# Patient Record
Sex: Female | Born: 1965
Health system: Southern US, Community
[De-identification: ages and names within clinical notes are randomized; demographics above are authoritative.]

---

## 1997-06-04 ENCOUNTER — Other Ambulatory Visit: Admission: RE | Admit: 1997-06-04 | Discharge: 1997-06-04 | Payer: Self-pay | Admitting: Obstetrics and Gynecology

## 1998-08-12 ENCOUNTER — Other Ambulatory Visit: Admission: RE | Admit: 1998-08-12 | Discharge: 1998-08-12 | Payer: Self-pay | Admitting: Obstetrics and Gynecology

## 1999-02-26 ENCOUNTER — Inpatient Hospital Stay (HOSPITAL_COMMUNITY): Admission: AD | Admit: 1999-02-26 | Discharge: 1999-03-01 | Payer: Self-pay | Admitting: Obstetrics and Gynecology

## 1999-04-10 ENCOUNTER — Other Ambulatory Visit: Admission: RE | Admit: 1999-04-10 | Discharge: 1999-04-10 | Payer: Self-pay | Admitting: Obstetrics and Gynecology

## 2000-04-22 ENCOUNTER — Other Ambulatory Visit: Admission: RE | Admit: 2000-04-22 | Discharge: 2000-04-22 | Payer: Self-pay | Admitting: Obstetrics and Gynecology

## 2001-06-10 ENCOUNTER — Inpatient Hospital Stay (HOSPITAL_COMMUNITY): Admission: AD | Admit: 2001-06-10 | Discharge: 2001-06-12 | Payer: Self-pay | Admitting: Obstetrics and Gynecology

## 2001-06-10 ENCOUNTER — Encounter (INDEPENDENT_AMBULATORY_CARE_PROVIDER_SITE_OTHER): Payer: Self-pay

## 2001-07-15 ENCOUNTER — Other Ambulatory Visit: Admission: RE | Admit: 2001-07-15 | Discharge: 2001-07-15 | Payer: Self-pay | Admitting: Obstetrics and Gynecology

## 2002-08-19 ENCOUNTER — Other Ambulatory Visit: Admission: RE | Admit: 2002-08-19 | Discharge: 2002-08-19 | Payer: Self-pay | Admitting: Obstetrics and Gynecology

## 2003-08-31 ENCOUNTER — Other Ambulatory Visit: Admission: RE | Admit: 2003-08-31 | Discharge: 2003-08-31 | Payer: Self-pay | Admitting: Obstetrics and Gynecology

## 2003-11-30 ENCOUNTER — Ambulatory Visit: Payer: Self-pay | Admitting: Internal Medicine

## 2003-12-06 ENCOUNTER — Ambulatory Visit: Payer: Self-pay | Admitting: Internal Medicine

## 2004-09-15 ENCOUNTER — Other Ambulatory Visit: Admission: RE | Admit: 2004-09-15 | Discharge: 2004-09-15 | Payer: Self-pay | Admitting: Obstetrics and Gynecology

## 2005-08-27 ENCOUNTER — Ambulatory Visit: Payer: Self-pay | Admitting: Internal Medicine

## 2007-05-12 ENCOUNTER — Encounter: Payer: Self-pay | Admitting: *Deleted

## 2007-05-12 DIAGNOSIS — F411 Generalized anxiety disorder: Secondary | ICD-10-CM | POA: Insufficient documentation

## 2007-05-12 DIAGNOSIS — R0602 Shortness of breath: Secondary | ICD-10-CM

## 2007-05-12 HISTORY — DX: Generalized anxiety disorder: F41.1

## 2007-05-12 HISTORY — DX: Shortness of breath: R06.02

## 2009-06-15 ENCOUNTER — Encounter: Admission: RE | Admit: 2009-06-15 | Discharge: 2009-06-15 | Payer: Self-pay | Admitting: Obstetrics and Gynecology

## 2010-07-23 HISTORY — PX: BREAST ENHANCEMENT SURGERY: SHX7

## 2010-08-07 ENCOUNTER — Other Ambulatory Visit: Payer: Self-pay | Admitting: Plastic Surgery

## 2010-08-07 DIAGNOSIS — Z1231 Encounter for screening mammogram for malignant neoplasm of breast: Secondary | ICD-10-CM

## 2010-08-11 ENCOUNTER — Ambulatory Visit
Admission: RE | Admit: 2010-08-11 | Discharge: 2010-08-11 | Disposition: A | Discharge status: Home / Self Care | Payer: BC Managed Care – PPO | Source: Ambulatory Visit | Attending: Plastic Surgery | Admitting: Plastic Surgery

## 2010-08-11 DIAGNOSIS — Z1231 Encounter for screening mammogram for malignant neoplasm of breast: Secondary | ICD-10-CM

## 2011-10-29 ENCOUNTER — Telehealth: Payer: Self-pay | Admitting: Internal Medicine

## 2011-10-29 NOTE — Telephone Encounter (Signed)
[name] wants to get a flu shot when she brings her dad, [name] next Wed.  Will this be OK? She last saw you in 2007.

## 2011-10-30 NOTE — Telephone Encounter (Signed)
Ok with me 

## 2011-11-02 NOTE — Telephone Encounter (Signed)
Appt scheduled

## 2011-11-08 ENCOUNTER — Ambulatory Visit (INDEPENDENT_AMBULATORY_CARE_PROVIDER_SITE_OTHER): Payer: BC Managed Care – PPO

## 2011-11-08 DIAGNOSIS — Z23 Encounter for immunization: Secondary | ICD-10-CM

## 2013-01-22 HISTORY — PX: VULVAR LESION REMOVAL: SHX5391

## 2013-02-20 ENCOUNTER — Ambulatory Visit (INDEPENDENT_AMBULATORY_CARE_PROVIDER_SITE_OTHER): Payer: PRIVATE HEALTH INSURANCE | Admitting: Surgery

## 2013-02-20 ENCOUNTER — Encounter (INDEPENDENT_AMBULATORY_CARE_PROVIDER_SITE_OTHER): Payer: Self-pay | Admitting: Surgery

## 2013-02-20 ENCOUNTER — Encounter (INDEPENDENT_AMBULATORY_CARE_PROVIDER_SITE_OTHER): Payer: Self-pay

## 2013-02-20 VITALS — BP 118/62 | HR 61 | Temp 97.8°F | Resp 18 | Ht 65.0 in | Wt 130.0 lb

## 2013-02-20 DIAGNOSIS — K648 Other hemorrhoids: Secondary | ICD-10-CM | POA: Insufficient documentation

## 2013-02-20 DIAGNOSIS — Z720 Tobacco use: Secondary | ICD-10-CM | POA: Insufficient documentation

## 2013-02-20 DIAGNOSIS — F17200 Nicotine dependence, unspecified, uncomplicated: Secondary | ICD-10-CM

## 2013-02-20 HISTORY — PX: HEMORRHOID BANDING: SHX5850

## 2013-02-20 HISTORY — DX: Tobacco use: Z72.0

## 2013-02-20 HISTORY — DX: Other hemorrhoids: K64.8

## 2013-02-20 NOTE — Patient Instructions (Signed)
ANORECTAL SURGERY: POST OP INSTRUCTIONS  1. Take your usually prescribed home medications unless otherwise directed. 2. DIET: Follow a light bland diet the first 24 hours after arrival home, such as soup, liquids, crackers, etc.  Be sure to include lots of fluids daily.  Avoid fast food or heavy meals as your are more likely to get nauseated.  Eat a low fat the next few days after surgery.   3. PAIN CONTROL: a. Pain is best controlled by a usual combination of three different methods TOGETHER: i. Ice/Heat ii. Over the counter pain medication iii. Prescription pain medication b. Most patients will experience some swelling and discomfort in the anus/rectal area. and incisions.  Ice packs or heat (30-60 minutes up to 6 times a day) will help. Use ice for the first few days to help decrease swelling and bruising, then switch to heat such as warm towels, sitz baths, warm baths, etc to help relax tight/sore spots and speed recovery.  Some people prefer to use ice alone, heat alone, alternating between ice & heat.  Experiment to what works for you.  Swelling and bruising can take several weeks to resolve.   c. It is helpful to take an over-the-counter pain medication regularly for the first few weeks.  Choose one of the following that works best for you: i. Naproxen (Aleve, etc)  Two 220mg tabs twice a day ii. Ibuprofen (Advil, etc) Three 200mg tabs four times a day (every meal & bedtime) iii. Acetaminophen (Tylenol, etc) 500-650mg four times a day (every meal & bedtime) d. A  prescription for pain medication (such as oxycodone, hydrocodone, etc) should be given to you upon discharge.  Take your pain medication as prescribed.  i. If you are having problems/concerns with the prescription medicine (does not control pain, nausea, vomiting, rash, itching, etc), please call us [phone] to see if we need to switch you to a different pain medicine that will work better for you and/or control your side effect  better. ii. If you need a refill on your pain medication, please contact your pharmacy.  They will contact our office to request authorization. Prescriptions will not be filled after 5 pm or on week-ends.  Use a Sitz Bath 4-8 times a day for relief A sitz bath is a warm water bath taken in the sitting position that covers only the hips and buttocks. It may be used for either healing or hygiene purposes. Sitz baths are also used to relieve pain, itching, or muscle spasms. The water may contain medicine. Moist heat will help you heal and relax.  HOME CARE INSTRUCTIONS  Take 3 to 4 sitz baths a day. 1. Fill the bathtub half full with warm water. 2. Sit in the water and open the drain a little. 3. Turn on the warm water to keep the tub half full. Keep the water running constantly. 4. Soak in the water for 15 to 20 minutes. 5. After the sitz bath, pat the affected area dry first. SEEK MEDICAL CARE IF:  You get worse instead of better. Stop the sitz baths if you get worse.   4. KEEP YOUR BOWELS REGULAR a. The goal is one bowel movement a day b. Avoid getting constipated.  Between the surgery and the pain medications, it is common to experience some constipation.  Increasing fluid intake and taking a fiber supplement (such as Metamucil, Citrucel, FiberCon, MiraLax, etc) 1-2 times a day regularly will usually help prevent this problem from occurring.  A mild   laxative (prune juice, Milk of Magnesia, MiraLax, etc) should be taken according to package directions if there are no bowel movements after 48 hours. c. Watch out for diarrhea.  If you have many loose bowel movements, simplify your diet to bland foods & liquids for a few days.  Stop any stool softeners and decrease your fiber supplement.  Switching to mild anti-diarrheal medications (Kayopectate, Pepto Bismol) can help.  If this worsens or does not improve, please call us.  5. Wound Care a. Remove your bandages the day after surgery.  Unless  discharge instructions indicate otherwise, leave your bandage dry and in place overnight.  Remove the bandage during your first bowel movement.   b. Allow the wound packing to fall out over the next few days.  You can trim exposed gauze / ribbon as it falls out.  You do not need to repack the wound unless instructed otherwise.  Wear an absorbent pad or soft cotton gauze in your underwear as needed to catch any drainage and help keep the area  c. Keep the area clean and dry.  Bathe / shower every day.  Keep the area clean by showering / bathing over the incision / wound.   It is okay to soak an open wound to help wash it.  Wet wipes or showers / gentle washing after bowel movements is often less traumatic than regular toilet paper. d. You may have some styrofoam-like soft packing in the rectum which will come out with the first bowel movement.  e. You will often notice bleeding with bowel movements.  This should slow down by the end of the first week of surgery f. Expect some drainage.  This should slow down, too, by the end of the first week of surgery.  Wear an absorbent pad or soft cotton gauze in your underwear until the drainage stops. 6. ACTIVITIES as tolerated:   a. You may resume regular (light) daily activities beginning the next day-such as daily self-care, walking, climbing stairs-gradually increasing activities as tolerated.  If you can walk 30 minutes without difficulty, it is safe to try more intense activity such as jogging, treadmill, bicycling, low-impact aerobics, swimming, etc. b. Save the most intensive and strenuous activity for last such as sit-ups, heavy lifting, contact sports, etc  Refrain from any heavy lifting or straining until you are off narcotics for pain control.   c. DO NOT PUSH THROUGH PAIN.  Let pain be your guide: If it hurts to do something, don't do it.  Pain is your body warning you to avoid that activity for another week until the pain goes down. d. You may drive when  you are no longer taking prescription pain medication, you can comfortably sit for long periods of time, and you can safely maneuver your car and apply brakes. e. You may have sexual intercourse when it is comfortable.  7. FOLLOW UP in our office a. Please call [facility] at [phone] to set up an appointment to see your surgeon in the office for a follow-up appointment approximately 2 weeks after your surgery. b. Make sure that you call for this appointment the day you arrive home to insure a convenient appointment time. 10. IF YOU HAVE DISABILITY OR FAMILY LEAVE FORMS, BRING THEM TO THE OFFICE FOR PROCESSING.  DO NOT GIVE THEM TO YOUR DOCTOR.        WHEN TO CALL US [phone]: 1. Poor pain control 2. Reactions / problems with new medications (rash/itching, nausea, etc)    3. Fever over 101.5 F (38.5 C) 4. Inability to urinate 5. Nausea and/or vomiting 6. Worsening swelling or bruising 7. Continued bleeding from incision. 8. Increased pain, redness, or drainage from the incision  The clinic staff is available to answer your questions during regular business hours (8:30am-5pm).  Please don't hesitate to call and ask to speak to one of our nurses for clinical concerns.   A surgeon from [facility] is always on call at the hospitals   If you have a medical emergency, go to the nearest emergency room or call 911.    [facility] [address], [address], [place],   [address] ? MAIN: [phone] ? TOLL FREE: [phone] ? FAX [phone] [email or web address]  HEMORRHOIDS  The rectum is the last foot of your colon, and it naturally stretches to hold stool.  Hemorrhoidal piles are natural clusters of blood vessels that help the rectum and anal canal stretch to hold stool and allow bowel movements to eliminate feces.   Hemorrhoids are abnormally swollen blood vessels in the rectum.  Too much pressure in the rectum causes  hemorrhoids by forcing blood to stretch and bulge the walls of the veins, sometimes even rupturing them.  Hemorrhoids can become like varicose veins you might see on a person's legs.  Most people will develop a flare of hemorrhoids in their lifetime.  When bulging hemorrhoidal veins are irritated, they can swell, burn, itch, cause pain, and bleed.  Most flares will calm down gradually own within a few weeks.  However, once hemorrhoids are created, they are difficult to get rid of completely and tend to flare more easily than the first flare.   Fortunately, good habits and simple medical treatment usually control hemorrhoids well, and surgery is needed only in severe cases. Types of Hemorrhoids:  Internal hemorrhoids usually don't initially hurt or itch; they are deep inside the rectum and usually have no sensation. If they begin to push out (prolapse), pain and burning can occur.  However, internal hemorrhoids can bleed.  Anal bleeding should not be ignored since bleeding could come from a dangerous source like colorectal cancer, so persistent rectal bleeding should be investigated by a doctor, sometimes with a colonoscopy.  External hemorrhoids cause most of the symptoms - pain, burning, and itching. Nonirritated hemorrhoids can look like small skin tags coming out of the anus.   Thrombosed hemorrhoids can form when a hemorrhoid blood vessel bursts and causes the hemorrhoid to suddenly swell.  A purple blood clot can form in it and become an excruciatingly painful lump at the anus. Because of these unpleasant symptoms, immediate incision and drainage by a surgeon at an office visit can provide much relief of the pain.    PREVENTION Avoiding the most frequent causes listed below will prevent most cases of hemorrhoids: Constipation Hard stools Diarrhea  Constant sitting  Straining with bowel movements Sitting on the toilet for a long time  Severe coughing  episodes Pregnancy / Childbirth  Heavy  Lifting  Sometimes avoiding the above triggers is difficult:  How can you avoid sitting all day if you have a seated job? Also, we try to avoid coughing and diarrhea, but sometimes it's beyond your control.  Still, there are some practical hints to help: Keep the anal and genital area clean.  Moistened tissues such as flushable wet wipes are less irritating than toilet paper.  Using irrigating showers or bottle irrigation washing gently cleans this sensitive area.     Avoid dry toilet paper when cleaning after bowel movements.  . Keep the anal and genital area dry.  Lightly pat the rectal area dry.  Avoid rubbing.  Talcum or baby powders can help GET YOUR STOOLS SOFT.   This is the most important way to prevent irritated hemorrhoids.  Hard stools are like sandpaper to the anorectal canal and will cause more problems.  The goal: ONE SOFT BOWEL MOVEMENT A DAY!  BMs from every other day to 3 times a day is a tolerable range Treat coughing, diarrhea and constipation early since irritated hemorrhoids may soon follow.  If your main job activity is seated, always stand or walk during your breaks. Make it a point to stand and walk at least 5 minutes every hour and try to shift frequently in your chair to avoid direct rectal pressure.  Always exhale as you strain or lift. Don't hold your breath.  Do not delay or try to prevent a bowel movement when the urge is present. Exercise regularly (walking or jogging 60 minutes a day) to stimulate the bowels to move. No reading or other activity while on the toilet. If bowel movements take longer than 5 minutes, you are too constipated. AVOID CONSTIPATION Drink plenty of liquids (1 1/2 to 2 quarts of water and other fluids a day unless fluid restricted for another medical condition). Liquids that contain caffeine (coffee a, tea, soft drinks) can be dehydrating and should be avoided until constipation is controlled. Consider minimizing milk, as dairy products may be  constipating. Eat plenty of fiber (30g a day ideal, more if needed).  Fiber is the undigested part of plant food that passes into the colon, acting as "natures broom" to encourage bowel motility and movement.  Fiber can absorb and hold large amounts of water. This results in a larger, bulkier stool, which is soft and easier to pass.  Eating foods high in fiber - 12 servings - such as  Vegetables: Root (potatoes, carrots, turnips), Leafy green (lettuce, salad greens, celery, spinach), High residue (cabbage, broccoli, etc.) Fruit: Fresh, Dried (prunes, apricots, cherries), Stewed (applesauce)  Whole grain breads, pasta, whole wheat Bran cereals, muffins, etc. Consider adding supplemental bulking fiber which retains large volumes of water: Psyllium ground seeds (native plant from [place])--available as Metamucil, Konsyl, Effersyllium, Per Diem Fiber, or the less expensive generic forms.  Citrucel  (methylcellulose wood fiber) . FiberCon (Polycarbophil) Polyethylene Glycol - and "artificial" fiber commonly called Miralax or Glycolax.  It is helpful for people with gassy or bloated feelings with regular fiber Flax Seed - a less gassy natural fiber  Laxatives can be useful for a short period if constipation is severe Osmotics (Milk of Magnesia, Fleets Phospho-Soda, Magnesium Citrate)  Stimulants (Senokot,   Castor Oil,  Dulcolax, Ex-Lax)    Laxatives are not a good long-term solution as it can stress the bowels and cause too much mineral loss and dehydration.   Avoid taking laxatives for more than 7 days in a row.  AVOID DIARRHEA Switch to liquids and simpler foods for a few days to avoid stressing your intestines further. Avoid dairy products (especially milk & ice cream) for a short time.  The intestines often can lose the ability to digest lactose when stressed. Avoid foods that cause gassiness or bloating.  Typical foods include beans and other legumes, cabbage, broccoli, and dairy foods.   Every person has some sensitivity to other foods, so listen to your body and avoid those foods that trigger problems for   you. Adding fiber (Citrucel, Metamucil, FiberCon, Flax seed, Miralax) gradually can help thicken stools by absorbing excess fluid and retrain the intestines to act more normally.  Slowly increase the dose over a few weeks.  Too much fiber too soon can backfire and cause cramping & bloating. Probiotics (such as active yogurt, Align, etc) may help repopulate the intestines and colon with normal bacteria and calm down a sensitive digestive tract.  Most studies show it to be of mild help, though, and such products can be costly. Medicines: Bismuth subsalicylate (ex. Kayopectate, Pepto Bismol) every 30 minutes for up to 6 doses can help control diarrhea.  Avoid if pregnant. Loperamide (Immodium) can slow down diarrhea.  Start with two tablets (4mg total) first and then try one tablet every 6 hours.  Avoid if you are having fevers or severe pain.  If you are not better or start feeling worse, stop all medicines and call your doctor for advice Call your doctor if you are getting worse or not better.  Sometimes further testing (cultures, endoscopy, X-ray studies, bloodwork, etc) may be needed to help diagnose and treat the cause of the diarrhea. TREATMENT OF HEMORRHOID FLARE If these preventive measures fail, you must take action right away! Hemorrhoids are one condition that can be mild in the morning and become intolerable by nightfall. Most hemorrhoidal flares take several weeks to calm down.  These suggestions can help: Warm soaks.  This helps more than any topical medication.  Use up to 8 times a day.  Usually sitz baths or sitting in a warm bathtub helps.  Sitting on moist warm towels are helpful.  Switching to ice packs/cool compresses can be helpful  Use a Sitz Bath 4-8 times a day for relief A sitz bath is a warm water bath taken in the sitting position that covers only the hips and  buttocks. It may be used for either healing or hygiene purposes. Sitz baths are also used to relieve pain, itching, or muscle spasms. The water may contain medicine. Moist heat will help you heal and relax.  HOME CARE INSTRUCTIONS  Take 3 to 4 sitz baths a day. 6. Fill the bathtub half full with warm water. 7. Sit in the water and open the drain a little. 8. Turn on the warm water to keep the tub half full. Keep the water running constantly. 9. Soak in the water for 15 to 20 minutes. 10. After the sitz bath, pat the affected area dry first. SEEK MEDICAL CARE IF:  You get worse instead of better. Stop the sitz baths if you get worse.  Normalize your bowels.  Extremes of diarrhea or constipation will make hemorrhoids worse.  One soft bowel movement a day is the goal.  Fiber can help get your bowels regular Wet wipes instead of toilet paper Pain control with a NSAID such as ibuprofen (Advil) or naproxen (Aleve) or acetaminophen (Tylenol) around the clock.  Narcotics are constipating and should be minimized if possible Topical creams contain steroids (bydrocortisone) or local anesthetic (xylocaine) can help make pain and itching more tolerable.   EVALUATION If hemorrhoids are still causing problems, you could benefit by an evaluation by a surgeon.  The surgeon will obtain a history and examine you.  If hemorrhoids are diagnosed, some therapies can be offered in the office, usually with an anoscope into the less sensitive area of the rectum: -injection of hemorrhoids (sclerotherapy) can scar the blood vessels of the swollen/enlarged hemorrhoids to help shrink them down to   a more normal size -rubber banding of the enlarged hemorrhoids to help shrink them down to a more normal size -drainage of the blood clot causing a thrombosed hemorrhoid,  to relieve the severe pain   While 90% of the time such problems from hemorrhoids can be managed without preceding to surgery, sometimes the hemorrhoids require a  operation to control the problem (uncontrolled bleeding, prolapse, pain, etc.).   This involves being placed under general anesthesia where the surgeon can confirm the diagnosis and remove, suture, or staple the hemorrhoid(s).  Your surgeon can help you treat the problem appropriately.    STOP SMOKING!  We strongly recommend that you stop smoking.  Smoking increases the risk of surgery including infection in the form of an open wound, pus formation, abscess, hernia at an incision on the abdomen, etc.  You have an increased risk of other MAJOR complications such as stroke, heart attack, forming clots in the leg and/or lungs, and death.    Smoking Cessation Quitting smoking is important to your health and has many advantages. However, it is not always easy to quit since nicotine is a very addictive drug. Often times, people try 3 times or more before being able to quit. This document explains the best ways for you to prepare to quit smoking. Quitting takes hard work and a lot of effort, but you can do it. ADVANTAGES OF QUITTING SMOKING  You will live longer, feel better, and live better.  Your body will feel the impact of quitting smoking almost immediately.  Within 20 minutes, blood pressure decreases. Your pulse returns to its normal level.  After 8 hours, carbon monoxide levels in the blood return to normal. Your oxygen level increases.  After 24 hours, the chance of having a heart attack starts to decrease. Your breath, hair, and body stop smelling like smoke.  After 48 hours, damaged nerve endings begin to recover. Your sense of taste and smell improve.  After 72 hours, the body is virtually free of nicotine. Your bronchial tubes relax and breathing becomes easier.  After 2 to 12 weeks, lungs can hold more air. Exercise becomes easier and circulation improves.  The risk of having a heart attack, stroke, cancer, or lung disease is greatly reduced.  After 1 year, the risk of coronary  heart disease is cut in half.  After 5 years, the risk of stroke falls to the same as a nonsmoker.  After 10 years, the risk of lung cancer is cut in half and the risk of other cancers decreases significantly.  After 15 years, the risk of coronary heart disease drops, usually to the level of a nonsmoker.  If you are pregnant, quitting smoking will improve your chances of having a healthy baby.  The people you live with, especially any children, will be healthier.  You will have extra money to spend on things other than cigarettes. QUESTIONS TO THINK ABOUT BEFORE ATTEMPTING TO QUIT You may want to talk about your answers with your caregiver.  Why do you want to quit?  If you tried to quit in the past, what helped and what did not?  What will be the most difficult situations for you after you quit? How will you plan to handle them?  Who can help you through the tough times? Your family? Friends? A caregiver?  What pleasures do you get from smoking? What ways can you still get pleasure if you quit? Here are some questions to ask your caregiver:  How can  you help me to be successful at quitting?  What medicine do you think would be best for me and how should I take it?  What should I do if I need more help?  What is smoking withdrawal like? How can I get information on withdrawal? GET READY  Set a quit date.  Change your environment by getting rid of all cigarettes, ashtrays, matches, and lighters in your home, car, or work. Do not let people smoke in your home.  Review your past attempts to quit. Think about what worked and what did not. GET SUPPORT AND ENCOURAGEMENT You have a better chance of being successful if you have help. You can get support in many ways.  Tell your family, friends, and co-workers that you are going to quit and need their support. Ask them not to smoke around you.  Get individual, group, or telephone counseling and support. Programs are available at  [employer] and health centers. Call your local health department for information about programs in your area.  Spiritual beliefs and practices may help some smokers quit.  Download a "quit meter" on your computer to keep track of quit statistics, such as how long you have gone without smoking, cigarettes not smoked, and money saved.  Get a self-help book about quitting smoking and staying off of tobacco. LEARN NEW SKILLS AND BEHAVIORS  Distract yourself from urges to smoke. Talk to someone, go for a walk, or occupy your time with a task.  Change your normal routine. Take a different route to work. Drink tea instead of coffee. Eat breakfast in a different place.  Reduce your stress. Take a hot bath, exercise, or read a book.  Plan something enjoyable to do every day. Reward yourself for not smoking.  Explore interactive web-based programs that specialize in helping you quit. GET MEDICINE AND USE IT CORRECTLY Medicines can help you stop smoking and decrease the urge to smoke. Combining medicine with the above behavioral methods and support can greatly increase your chances of successfully quitting smoking.  Nicotine replacement therapy helps deliver nicotine to your body without the negative effects and risks of smoking. Nicotine replacement therapy includes nicotine gum, lozenges, inhalers, nasal sprays, and skin patches. Some may be available over-the-counter and others require a prescription.  Antidepressant medicine helps people abstain from smoking, but how this works is unknown. This medicine is available by prescription.  Nicotinic receptor partial agonist medicine simulates the effect of nicotine in your brain. This medicine is available by prescription. Ask your caregiver for advice about which medicines to use and how to use them based on your health history. Your caregiver will tell you what side effects to look out for if you choose to be on a medicine or therapy. Carefully  read the information on the package. Do not use any other product containing nicotine while using a nicotine replacement product.  RELAPSE OR DIFFICULT SITUATIONS Most relapses occur within the first 3 months after quitting. Do not be discouraged if you start smoking again. Remember, most people try several times before finally quitting. You may have symptoms of withdrawal because your body is used to nicotine. You may crave cigarettes, be irritable, feel very hungry, cough often, get headaches, or have difficulty concentrating. The withdrawal symptoms are only temporary. They are strongest when you first quit, but they will go away within 10 14 days. To reduce the chances of relapse, try to:  Avoid drinking alcohol. Drinking lowers your chances of successfully quitting.  Reduce  the amount of caffeine you consume. Once you quit smoking, the amount of caffeine in your body increases and can give you symptoms, such as a rapid heartbeat, sweating, and anxiety.  Avoid smokers because they can make you want to smoke.  Do not let weight gain distract you. Many smokers will gain weight when they quit, usually less than 10 pounds. Eat a healthy diet and stay active. You can always lose the weight gained after you quit.  Find ways to improve your mood other than smoking. FOR MORE INFORMATION  www.smokefree.gov    While it can be one of the most difficult things to do, the [place] has programs to help you stop.  Consider talking with your primary care physician about options.  Also, Smoking Cessation classes are available through the [facility]:  The smoking cessation program is a proven-effective program from the American Lung Association. The program is available for anyone 60 and older who currently smokes. The program lasts for 7 weeks and is 8 sessions. Each class will be approximately 1 1/2 hours. The program is every Tuesday.  All classes are 12-1:30pm and same location.  Event Location  Information:  Location: [facility] 2nd Floor Conference Room 2-037; located next to [facility] cross streets: [facility] Entrance into the [facility] is adjacent to the [facility] main entrance. The conference room is located on the 2nd floor.  Parking Instructions: Visitor parking is adjacent to [employer] main entrance and the [employer] [phone] or check the Classes and Support Groups   [email or web address] the event of inclemet weather please call [phone] or view online at [email or web address]

## 2013-02-20 NOTE — Progress Notes (Signed)
Subjective:     Patient ID: [name], female   DOB: 11/30/1965, 48 y.o.   MRN: [id number]  HPI  Note: This dictation was prepared with Dragon/digital dictation along with [employer]. Any transcriptional errors that result from this process are unintentional.       DIEM DICOCCO  1965-12-05 [id number]  Patient Care Team: Juluis Mire, MD as PCP - General (Obstetrics and Gynecology)  This patient is a 48 y.o.female who presents today for surgical evaluation at the request of Dr. Arelia Sneddon.   Reason for visit: Hemorrhoids  Was not smoking female that has a struggle with hemorrhoids for 15 years.  Mainly since having her children.  Intermittent flares.  Has worsened in the past 3 months.  Irritating.  Usually flared with an episode of constipation.  She has tried Preparation H, prescription suppositories, hot baths, cold packs.  Mild relief.  Frustrated.  Mentioned to her gynecologist.  Hemorrhoids confirmed.  Surgical consultation recommended.  She usually has one bowel movement a day.  Occasionally hard.  Never had a colonoscopy.  No prior anorectal intervention such as injection/banding/surgery.  She does smoke a pack a day of cigarettes.  Her father struggled with some hemorrhoid issues.  No personal nor family history of GI/colon cancer, inflammatory bowel disease, irritable bowel syndrome, allergy such as Celiac Sprue, dietary/dairy problems, colitis, ulcers nor gastritis.  No recent sick contacts/gastroenteritis.  No travel outside the country.  No changes in diet.  No dysphagia to solids or liquids.  No significant heartburn or reflux.  No hematochezia, hematemesis, coffee ground emesis.  No evidence of prior gastric/peptic ulceration.    Patient Active Problem List   Diagnosis Date Noted  . Internal hemorrhoids with complication 02/20/2013  . ANXIETY 05/12/2007  . DYSPNEA 05/12/2007    No past medical history on file.  Past Surgical History    Procedure Laterality Date  . Breast enhancement surgery  07/2010    History   Social History  . Marital Status: Single    Spouse Name: N/A    Number of Children: N/A  . Years of Education: N/A   Occupational History  . Not on file.   Social History Main Topics  . Smoking status: Former Games developer  . Smokeless tobacco: Not on file     Comment: 1 pk QD X 30 YRS  . Alcohol Use: Not on file  . Drug Use: Not on file  . Sexual Activity: Not on file   Other Topics Concern  . Not on file   Social History Narrative  . No narrative on file    Family History  Problem Relation Age of Onset  . Cancer Mother     Lung ca    Current Outpatient Prescriptions  Medication Sig Dispense Refill  . Cholecalciferol (VITAMIN D-3 PO) Take by mouth.      . triamcinolone (KENALOG) 0.1 % paste        No current facility-administered medications for this visit.     No Known Allergies  BP 118/62  Pulse 61  Temp(Src) 97.8 F (36.6 C)  Resp 18  Ht 5\' 5"  (1.651 m)  Wt 130 lb (58.968 kg)  BMI 21.63 kg/m2  No results found.   Review of Systems  Constitutional: Negative for fever, chills, diaphoresis, appetite change and fatigue.  HENT: Negative for ear discharge, ear pain, sore throat and trouble swallowing.   Eyes: Negative for photophobia, discharge and visual disturbance.  Respiratory: Negative for cough, choking, chest  tightness and shortness of breath.   Cardiovascular: Negative for chest pain and palpitations.  Gastrointestinal: Negative for nausea, vomiting, abdominal pain, diarrhea, constipation, anal bleeding and rectal pain.  Endocrine: Negative for cold intolerance and heat intolerance.  Genitourinary: Negative for dysuria, frequency and difficulty urinating.  Musculoskeletal: Negative for gait problem, myalgias and neck pain.  Skin: Negative for color change, pallor and rash.  Allergic/Immunologic: Negative for environmental allergies, food allergies and immunocompromised  state.  Neurological: Negative for dizziness, speech difficulty, weakness and numbness.  Hematological: Negative for adenopathy.  Psychiatric/Behavioral: Negative for confusion and agitation. The patient is not nervous/anxious.        Objective:   Physical Exam  Constitutional: She is oriented to person, place, and time. She appears well-developed and well-nourished. No distress.  HENT:  Head: Normocephalic.  Mouth/Throat: Oropharynx is clear and moist. No oropharyngeal exudate.  Eyes: Conjunctivae and EOM are normal. Pupils are equal, round, and reactive to light. No scleral icterus.  Neck: Normal range of motion. Neck supple. No tracheal deviation present.  Cardiovascular: Normal rate, regular rhythm and intact distal pulses.   Pulmonary/Chest: Effort normal and breath sounds normal. No stridor. No respiratory distress. She exhibits no tenderness.  Abdominal: Soft. She exhibits no distension and no mass. There is no tenderness. Hernia confirmed negative in the right inguinal area and confirmed negative in the left inguinal area.  Genitourinary:    No vaginal discharge found.  Exam done with assistance of female Medical Assistant in the room.  Perianal skin clean with good hygiene.  No pruritis.  No pilonidal disease.  No fissure.  No abscess/fistula.  Normal sphincter tone.  Tolerates digital and anoscopic rectal exam.  No rectal masses.  Mildly enlarged right anterior and posterior hemorrhoids with mild prolapsing.  Sensitive.  Left lateral pile more WNL   Musculoskeletal: Normal range of motion. She exhibits no tenderness.       Right elbow: She exhibits normal range of motion.       Left elbow: She exhibits normal range of motion.       Right wrist: She exhibits normal range of motion.       Left wrist: She exhibits normal range of motion.       Right hand: Normal strength noted.       Left hand: Normal strength noted.  Lymphadenopathy:       Head (right side): No posterior  auricular adenopathy present.       Head (left side): No posterior auricular adenopathy present.    She has no cervical adenopathy.    She has no axillary adenopathy.       Right: No inguinal adenopathy present.       Left: No inguinal adenopathy present.  Neurological: She is alert and oriented to person, place, and time. No cranial nerve deficit. She exhibits normal muscle tone. Coordination normal.  Skin: Skin is warm and dry. No rash noted. She is not diaphoretic. No erythema.  Psychiatric: She has a normal mood and affect. Her behavior is normal. Judgment and thought content normal.       Assessment:     Internal hemorrhoids with symptomatic prolapse despite good bowel regimen.     Plan:     I discussed with the patient.  I recommended hemorrhoidal banding.  She agreed:  The anatomy & physiology of the anorectal region was discussed.  The pathophysiology of hemorrhoids and differential diagnosis was discussed.  Natural history progression  was discussed.   I  stressed the importance of a bowel regimen to have daily soft bowel movements to minimize progression of disease.     The patient's symptoms are not adequately controlled.  Therefore, I recommended banding to treat the hemorrhoids.  I went over the technique, risks, benefits, and alternatives.   Goals of post-operative recovery were discussed as well.  Questions were answered.  The patient expressed understanding & wished to proceed.  The patient was positioned in the lateral decubitus position.  Perianal & rectal examination was done.  Using anoscopy, I ligated the hemorrhoids (R anterior & R posterior piles) above the dentate line with banding.  The patient tolerated the procedure well.  Educational handouts further explaining the pathology, treatment options, and bowel regimen were given as well.    STOP SMOKING! We talked to the patient about the dangers of smoking.  We stressed that tobacco use dramatically increases the risk  of peri-operative complications such as infection, tissue necrosis leaving to problems with incision/wound and organ healing, hernia, chronic pain, heart attack, stroke, DVT, pulmonary embolism, and death.  We noted there are programs in our community to help stop smoking.  Information was available.

## 2013-02-24 ENCOUNTER — Encounter (INDEPENDENT_AMBULATORY_CARE_PROVIDER_SITE_OTHER): Payer: Self-pay

## 2013-06-17 ENCOUNTER — Ambulatory Visit (INDEPENDENT_AMBULATORY_CARE_PROVIDER_SITE_OTHER): Payer: 59 | Admitting: Surgery

## 2013-06-17 ENCOUNTER — Encounter (INDEPENDENT_AMBULATORY_CARE_PROVIDER_SITE_OTHER): Payer: Self-pay | Admitting: Surgery

## 2013-06-17 VITALS — BP 116/70 | HR 72 | Resp 18 | Ht 65.0 in | Wt 126.0 lb

## 2013-06-17 DIAGNOSIS — K648 Other hemorrhoids: Secondary | ICD-10-CM

## 2013-06-17 NOTE — Patient Instructions (Signed)
ANORECTAL SURGERY: POST OP INSTRUCTIONS  1. Take your usually prescribed home medications unless otherwise directed. 2. DIET: Follow a light bland diet the first 24 hours after arrival home, such as soup, liquids, crackers, etc.  Be sure to include lots of fluids daily.  Avoid fast food or heavy meals as your are more likely to get nauseated.  Eat a low fat the next few days after surgery.   3. PAIN CONTROL: a. Pain is best controlled by a usual combination of three different methods TOGETHER: i. Ice/Heat ii. Over the counter pain medication iii. Prescription pain medication b. Most patients will experience some swelling and discomfort in the anus/rectal area. and incisions.  Ice packs or heat (30-60 minutes up to 6 times a day) will help. Use ice for the first few days to help decrease swelling and bruising, then switch to heat such as warm towels, sitz baths, warm baths, etc to help relax tight/sore spots and speed recovery.  Some people prefer to use ice alone, heat alone, alternating between ice & heat.  Experiment to what works for you.  Swelling and bruising can take several weeks to resolve.   c. It is helpful to take an over-the-counter pain medication regularly for the first few weeks.  Choose one of the following that works best for you: i. Naproxen (Aleve, etc)  Two 220mg tabs twice a day ii. Ibuprofen (Advil, etc) Three 200mg tabs four times a day (every meal & bedtime) iii. Acetaminophen (Tylenol, etc) 500-650mg four times a day (every meal & bedtime) d. A  prescription for pain medication (such as oxycodone, hydrocodone, etc) should be given to you upon discharge.  Take your pain medication as prescribed.  i. If you are having problems/concerns with the prescription medicine (does not control pain, nausea, vomiting, rash, itching, etc), please call us [phone] to see if we need to switch you to a different pain medicine that will work better for you and/or control your side effect  better. ii. If you need a refill on your pain medication, please contact your pharmacy.  They will contact our office to request authorization. Prescriptions will not be filled after 5 pm or on week-ends.  Use a Sitz Bath 4-8 times a day for relief A sitz bath is a warm water bath taken in the sitting position that covers only the hips and buttocks. It may be used for either healing or hygiene purposes. Sitz baths are also used to relieve pain, itching, or muscle spasms. The water may contain medicine. Moist heat will help you heal and relax.  HOME CARE INSTRUCTIONS  Take 3 to 4 sitz baths a day. 1. Fill the bathtub half full with warm water. 2. Sit in the water and open the drain a little. 3. Turn on the warm water to keep the tub half full. Keep the water running constantly. 4. Soak in the water for 15 to 20 minutes. 5. After the sitz bath, pat the affected area dry first. SEEK MEDICAL CARE IF:  You get worse instead of better. Stop the sitz baths if you get worse.   4. KEEP YOUR BOWELS REGULAR a. The goal is one bowel movement a day b. Avoid getting constipated.  Between the surgery and the pain medications, it is common to experience some constipation.  Increasing fluid intake and taking a fiber supplement (such as Metamucil, Citrucel, FiberCon, MiraLax, etc) 1-2 times a day regularly will usually help prevent this problem from occurring.  A mild   laxative (prune juice, Milk of Magnesia, MiraLax, etc) should be taken according to package directions if there are no bowel movements after 48 hours. c. Watch out for diarrhea.  If you have many loose bowel movements, simplify your diet to bland foods & liquids for a few days.  Stop any stool softeners and decrease your fiber supplement.  Switching to mild anti-diarrheal medications (Kayopectate, Pepto Bismol) can help.  If this worsens or does not improve, please call us.  5. Wound Care a. Remove your bandages the day after surgery.  Unless  discharge instructions indicate otherwise, leave your bandage dry and in place overnight.  Remove the bandage during your first bowel movement.   b. Allow the wound packing to fall out over the next few days.  You can trim exposed gauze / ribbon as it falls out.  You do not need to repack the wound unless instructed otherwise.  Wear an absorbent pad or soft cotton gauze in your underwear as needed to catch any drainage and help keep the area  c. Keep the area clean and dry.  Bathe / shower every day.  Keep the area clean by showering / bathing over the incision / wound.   It is okay to soak an open wound to help wash it.  Wet wipes or showers / gentle washing after bowel movements is often less traumatic than regular toilet paper. d. You may have some styrofoam-like soft packing in the rectum which will come out with the first bowel movement.  e. You will often notice bleeding with bowel movements.  This should slow down by the end of the first week of surgery f. Expect some drainage.  This should slow down, too, by the end of the first week of surgery.  Wear an absorbent pad or soft cotton gauze in your underwear until the drainage stops. 6. ACTIVITIES as tolerated:   a. You may resume regular (light) daily activities beginning the next day-such as daily self-care, walking, climbing stairs-gradually increasing activities as tolerated.  If you can walk 30 minutes without difficulty, it is safe to try more intense activity such as jogging, treadmill, bicycling, low-impact aerobics, swimming, etc. b. Save the most intensive and strenuous activity for last such as sit-ups, heavy lifting, contact sports, etc  Refrain from any heavy lifting or straining until you are off narcotics for pain control.   c. DO NOT PUSH THROUGH PAIN.  Let pain be your guide: If it hurts to do something, don't do it.  Pain is your body warning you to avoid that activity for another week until the pain goes down. d. You may drive when  you are no longer taking prescription pain medication, you can comfortably sit for long periods of time, and you can safely maneuver your car and apply brakes. e. You may have sexual intercourse when it is comfortable.  7. FOLLOW UP in our office a. Please call [facility] at [phone] to set up an appointment to see your surgeon in the office for a follow-up appointment approximately 2 weeks after your surgery. b. Make sure that you call for this appointment the day you arrive home to insure a convenient appointment time. 10. IF YOU HAVE DISABILITY OR FAMILY LEAVE FORMS, BRING THEM TO THE OFFICE FOR PROCESSING.  DO NOT GIVE THEM TO YOUR DOCTOR.        WHEN TO CALL US [phone]: 1. Poor pain control 2. Reactions / problems with new medications (rash/itching, nausea, etc)    3. Fever over 101.5 F (38.5 C) 4. Inability to urinate 5. Nausea and/or vomiting 6. Worsening swelling or bruising 7. Continued bleeding from incision. 8. Increased pain, redness, or drainage from the incision  The clinic staff is available to answer your questions during regular business hours (8:30am-5pm).  Please don't hesitate to call and ask to speak to one of our nurses for clinical concerns.   A surgeon from [facility] is always on call at the hospitals   If you have a medical emergency, go to the nearest emergency room or call 911.    [facility] [address], [address], [place],   [address] ? MAIN: [phone] ? TOLL FREE: [phone] ? FAX [phone] [email or web address]  HEMORRHOIDS  The rectum is the last foot of your colon, and it naturally stretches to hold stool.  Hemorrhoidal piles are natural clusters of blood vessels that help the rectum and anal canal stretch to hold stool and allow bowel movements to eliminate feces.   Hemorrhoids are abnormally swollen blood vessels in the rectum.  Too much pressure in the rectum causes  hemorrhoids by forcing blood to stretch and bulge the walls of the veins, sometimes even rupturing them.  Hemorrhoids can become like varicose veins you might see on a person's legs.  Most people will develop a flare of hemorrhoids in their lifetime.  When bulging hemorrhoidal veins are irritated, they can swell, burn, itch, cause pain, and bleed.  Most flares will calm down gradually own within a few weeks.  However, once hemorrhoids are created, they are difficult to get rid of completely and tend to flare more easily than the first flare.   Fortunately, good habits and simple medical treatment usually control hemorrhoids well, and surgery is needed only in severe cases. Types of Hemorrhoids:  Internal hemorrhoids usually don't initially hurt or itch; they are deep inside the rectum and usually have no sensation. If they begin to push out (prolapse), pain and burning can occur.  However, internal hemorrhoids can bleed.  Anal bleeding should not be ignored since bleeding could come from a dangerous source like colorectal cancer, so persistent rectal bleeding should be investigated by a doctor, sometimes with a colonoscopy.  External hemorrhoids cause most of the symptoms - pain, burning, and itching. Nonirritated hemorrhoids can look like small skin tags coming out of the anus.   Thrombosed hemorrhoids can form when a hemorrhoid blood vessel bursts and causes the hemorrhoid to suddenly swell.  A purple blood clot can form in it and become an excruciatingly painful lump at the anus. Because of these unpleasant symptoms, immediate incision and drainage by a surgeon at an office visit can provide much relief of the pain.    PREVENTION Avoiding the most frequent causes listed below will prevent most cases of hemorrhoids: Constipation Hard stools Diarrhea  Constant sitting  Straining with bowel movements Sitting on the toilet for a long time  Severe coughing  episodes Pregnancy / Childbirth  Heavy  Lifting  Sometimes avoiding the above triggers is difficult:  How can you avoid sitting all day if you have a seated job? Also, we try to avoid coughing and diarrhea, but sometimes it's beyond your control.  Still, there are some practical hints to help: Keep the anal and genital area clean.  Moistened tissues such as flushable wet wipes are less irritating than toilet paper.  Using irrigating showers or bottle irrigation washing gently cleans this sensitive area.     Avoid dry toilet paper when cleaning after bowel movements.  . Keep the anal and genital area dry.  Lightly pat the rectal area dry.  Avoid rubbing.  Talcum or baby powders can help GET YOUR STOOLS SOFT.   This is the most important way to prevent irritated hemorrhoids.  Hard stools are like sandpaper to the anorectal canal and will cause more problems.  The goal: ONE SOFT BOWEL MOVEMENT A DAY!  BMs from every other day to 3 times a day is a tolerable range Treat coughing, diarrhea and constipation early since irritated hemorrhoids may soon follow.  If your main job activity is seated, always stand or walk during your breaks. Make it a point to stand and walk at least 5 minutes every hour and try to shift frequently in your chair to avoid direct rectal pressure.  Always exhale as you strain or lift. Don't hold your breath.  Do not delay or try to prevent a bowel movement when the urge is present. Exercise regularly (walking or jogging 60 minutes a day) to stimulate the bowels to move. No reading or other activity while on the toilet. If bowel movements take longer than 5 minutes, you are too constipated. AVOID CONSTIPATION Drink plenty of liquids (1 1/2 to 2 quarts of water and other fluids a day unless fluid restricted for another medical condition). Liquids that contain caffeine (coffee a, tea, soft drinks) can be dehydrating and should be avoided until constipation is controlled. Consider minimizing milk, as dairy products may be  constipating. Eat plenty of fiber (30g a day ideal, more if needed).  Fiber is the undigested part of plant food that passes into the colon, acting as "natures broom" to encourage bowel motility and movement.  Fiber can absorb and hold large amounts of water. This results in a larger, bulkier stool, which is soft and easier to pass.  Eating foods high in fiber - 12 servings - such as  Vegetables: Root (potatoes, carrots, turnips), Leafy green (lettuce, salad greens, celery, spinach), High residue (cabbage, broccoli, etc.) Fruit: Fresh, Dried (prunes, apricots, cherries), Stewed (applesauce)  Whole grain breads, pasta, whole wheat Bran cereals, muffins, etc. Consider adding supplemental bulking fiber which retains large volumes of water: Psyllium ground seeds (native plant from [place])--available as Metamucil, Konsyl, Effersyllium, Per Diem Fiber, or the less expensive generic forms.  Citrucel  (methylcellulose wood fiber) . FiberCon (Polycarbophil) Polyethylene Glycol - and "artificial" fiber commonly called Miralax or Glycolax.  It is helpful for people with gassy or bloated feelings with regular fiber Flax Seed - a less gassy natural fiber  Laxatives can be useful for a short period if constipation is severe Osmotics (Milk of Magnesia, Fleets Phospho-Soda, Magnesium Citrate)  Stimulants (Senokot,   Castor Oil,  Dulcolax, Ex-Lax)    Laxatives are not a good long-term solution as it can stress the bowels and cause too much mineral loss and dehydration.   Avoid taking laxatives for more than 7 days in a row.  AVOID DIARRHEA Switch to liquids and simpler foods for a few days to avoid stressing your intestines further. Avoid dairy products (especially milk & ice cream) for a short time.  The intestines often can lose the ability to digest lactose when stressed. Avoid foods that cause gassiness or bloating.  Typical foods include beans and other legumes, cabbage, broccoli, and dairy foods.   Every person has some sensitivity to other foods, so listen to your body and avoid those foods that trigger problems for   you. Adding fiber (Citrucel, Metamucil, FiberCon, Flax seed, Miralax) gradually can help thicken stools by absorbing excess fluid and retrain the intestines to act more normally.  Slowly increase the dose over a few weeks.  Too much fiber too soon can backfire and cause cramping & bloating. Probiotics (such as active yogurt, Align, etc) may help repopulate the intestines and colon with normal bacteria and calm down a sensitive digestive tract.  Most studies show it to be of mild help, though, and such products can be costly. Medicines: Bismuth subsalicylate (ex. Kayopectate, Pepto Bismol) every 30 minutes for up to 6 doses can help control diarrhea.  Avoid if pregnant. Loperamide (Immodium) can slow down diarrhea.  Start with two tablets (4mg total) first and then try one tablet every 6 hours.  Avoid if you are having fevers or severe pain.  If you are not better or start feeling worse, stop all medicines and call your doctor for advice Call your doctor if you are getting worse or not better.  Sometimes further testing (cultures, endoscopy, X-ray studies, bloodwork, etc) may be needed to help diagnose and treat the cause of the diarrhea. TREATMENT OF HEMORRHOID FLARE If these preventive measures fail, you must take action right away! Hemorrhoids are one condition that can be mild in the morning and become intolerable by nightfall. Most hemorrhoidal flares take several weeks to calm down.  These suggestions can help: Warm soaks.  This helps more than any topical medication.  Use up to 8 times a day.  Usually sitz baths or sitting in a warm bathtub helps.  Sitting on moist warm towels are helpful.  Switching to ice packs/cool compresses can be helpful  Use a Sitz Bath 4-8 times a day for relief A sitz bath is a warm water bath taken in the sitting position that covers only the hips and  buttocks. It may be used for either healing or hygiene purposes. Sitz baths are also used to relieve pain, itching, or muscle spasms. The water may contain medicine. Moist heat will help you heal and relax.  HOME CARE INSTRUCTIONS  Take 3 to 4 sitz baths a day. 6. Fill the bathtub half full with warm water. 7. Sit in the water and open the drain a little. 8. Turn on the warm water to keep the tub half full. Keep the water running constantly. 9. Soak in the water for 15 to 20 minutes. 10. After the sitz bath, pat the affected area dry first. SEEK MEDICAL CARE IF:  You get worse instead of better. Stop the sitz baths if you get worse.  Normalize your bowels.  Extremes of diarrhea or constipation will make hemorrhoids worse.  One soft bowel movement a day is the goal.  Fiber can help get your bowels regular Wet wipes instead of toilet paper Pain control with a NSAID such as ibuprofen (Advil) or naproxen (Aleve) or acetaminophen (Tylenol) around the clock.  Narcotics are constipating and should be minimized if possible Topical creams contain steroids (bydrocortisone) or local anesthetic (xylocaine) can help make pain and itching more tolerable.   EVALUATION If hemorrhoids are still causing problems, you could benefit by an evaluation by a surgeon.  The surgeon will obtain a history and examine you.  If hemorrhoids are diagnosed, some therapies can be offered in the office, usually with an anoscope into the less sensitive area of the rectum: -injection of hemorrhoids (sclerotherapy) can scar the blood vessels of the swollen/enlarged hemorrhoids to help shrink them down to   a more normal size -rubber banding of the enlarged hemorrhoids to help shrink them down to a more normal size -drainage of the blood clot causing a thrombosed hemorrhoid,  to relieve the severe pain   While 90% of the time such problems from hemorrhoids can be managed without preceding to surgery, sometimes the hemorrhoids require a  operation to control the problem (uncontrolled bleeding, prolapse, pain, etc.).   This involves being placed under general anesthesia where the surgeon can confirm the diagnosis and remove, suture, or staple the hemorrhoid(s).  Your surgeon can help you treat the problem appropriately.    GETTING TO GOOD BOWEL HEALTH. Irregular bowel habits such as constipation and diarrhea can lead to many problems over time.  Having one soft bowel movement a day is the most important way to prevent further problems.  The anorectal canal is designed to handle stretching and feces to safely manage our ability to get rid of solid waste (feces, poop, stool) out of our body.  BUT, hard constipated stools can act like ripping concrete bricks and diarrhea can be a burning fire to this very sensitive area of our body, causing inflamed hemorrhoids, anal fissures, increasing risk is perirectal abscesses, abdominal pain/bloating, an making irritable bowel worse.     The goal: ONE SOFT BOWEL MOVEMENT A DAY!  To have soft, regular bowel movements:    Drink at least 8 tall glasses of water a day.     Take plenty of fiber.  Fiber is the undigested part of plant food that passes into the colon, acting s "natures broom" to encourage bowel motility and movement.  Fiber can absorb and hold large amounts of water. This results in a larger, bulkier stool, which is soft and easier to pass. Work gradually over several weeks up to 6 servings a day of fiber (25g a day even more if needed) in the form of: o Vegetables -- Root (potatoes, carrots, turnips), leafy green (lettuce, salad greens, celery, spinach), or cooked high residue (cabbage, broccoli, etc) o Fruit -- Fresh (unpeeled skin & pulp), Dried (prunes, apricots, cherries, etc ),  or stewed ( applesauce)  o Whole grain breads, pasta, etc (whole wheat)  o Bran cereals    Bulking Agents -- This type of water-retaining fiber generally is easily obtained each day by one of the following:   o Psyllium bran -- The psyllium plant is remarkable because its ground seeds can retain so much water. This product is available as Metamucil, Konsyl, Effersyllium, Per Diem Fiber, or the less expensive generic preparation in drug and health food stores. Although labeled a laxative, it really is not a laxative.  o Methylcellulose -- This is another fiber derived from wood which also retains water. It is available as Citrucel. o Polyethylene Glycol - and "artificial" fiber commonly called Miralax or Glycolax.  It is helpful for people with gassy or bloated feelings with regular fiber o Flax Seed - a less gassy fiber than psyllium   No reading or other relaxing activity while on the toilet. If bowel movements take longer than 5 minutes, you are too constipated   AVOID CONSTIPATION.  High fiber and water intake usually takes care of this.  Sometimes a laxative is needed to stimulate more frequent bowel movements, but    Laxatives are not a good long-term solution as it can wear the colon out. o Osmotics (Milk of Magnesia, Fleets phosphosoda, Magnesium citrate, MiraLax, GoLytely) are safer than  o Stimulants (Senokot, Castor Oil, Dulcolax,   Ex Lax)    o Do not take laxatives for more than 7days in a row.    IF SEVERELY CONSTIPATED, try a Bowel Retraining Program: o Do not use laxatives.  o Eat a diet high in roughage, such as bran cereals and leafy vegetables.  o Drink six (6) ounces of prune or apricot juice each morning.  o Eat two (2) large servings of stewed fruit each day.  o Take one (1) heaping tablespoon of a psyllium-based bulking agent twice a day. Use sugar-free sweetener when possible to avoid excessive calories.  o Eat a normal breakfast.  o Set aside 15 minutes after breakfast to sit on the toilet, but do not strain to have a bowel movement.  o If you do not have a bowel movement by the third day, use an enema and repeat the above steps.    Controlling diarrhea o Switch to liquids and  simpler foods for a few days to avoid stressing your intestines further. o Avoid dairy products (especially milk & ice cream) for a short time.  The intestines often can lose the ability to digest lactose when stressed. o Avoid foods that cause gassiness or bloating.  Typical foods include beans and other legumes, cabbage, broccoli, and dairy foods.  Every person has some sensitivity to other foods, so listen to our body and avoid those foods that trigger problems for you. o Adding fiber (Citrucel, Metamucil, psyllium, Miralax) gradually can help thicken stools by absorbing excess fluid and retrain the intestines to act more normally.  Slowly increase the dose over a few weeks.  Too much fiber too soon can backfire and cause cramping & bloating. o Probiotics (such as active yogurt, Align, etc) may help repopulate the intestines and colon with normal bacteria and calm down a sensitive digestive tract.  Most studies show it to be of mild help, though, and such products can be costly. o Medicines:   Bismuth subsalicylate (ex. Kayopectate, Pepto Bismol) every 30 minutes for up to 6 doses can help control diarrhea.  Avoid if pregnant.   Loperamide (Immodium) can slow down diarrhea.  Start with two tablets (4mg total) first and then try one tablet every 6 hours.  Avoid if you are having fevers or severe pain.  If you are not better or start feeling worse, stop all medicines and call your doctor for advice o Call your doctor if you are getting worse or not better.  Sometimes further testing (cultures, endoscopy, X-ray studies, bloodwork, etc) may be needed to help diagnose and treat the cause of the diarrhea. o  

## 2013-06-17 NOTE — Progress Notes (Signed)
Subjective:     Patient ID: [name], female   DOB: Apr 15, 1965, 48 y.o.   MRN: [id number]  HPI  Note: This dictation was prepared with Dragon/digital dictation along with [employer]. Any transcriptional errors that result from this process are unintentional.       [name]  05-18-1965 [id number]  Patient Care Team: Juluis Mire, MD as PCP - General (Obstetrics and Gynecology)  Procedure (Date: 02/20/2013):  Hemorrhoidal banding  This patient returns for surgical re-evaluation.  She notes that the banding of hemorrhoids that I did in January helped for several months.  However she began to notice irritation after bowel movements last month.  It has persisted.  She has remained compliant on the MiraLAX every day.  She now has daily bowel movements.  Denies any prolapsing material.  Not much in the way of bleeding.  Just irritating.  She wondered if repeat banding or other intervention would help.  No fevers or chills.  Patient Active Problem List   Diagnosis Date Noted  . Internal hemorrhoids with prolapse 02/20/2013  . Tobacco abuse 02/20/2013  . ANXIETY 05/12/2007  . DYSPNEA 05/12/2007    History reviewed. No pertinent past medical history.  Past Surgical History  Procedure Laterality Date  . Breast enhancement surgery  07/2010  . Vulvar lesion removal  Jan 2015    Dr Richardean Chimera  . Hemorrhoid banding  02/20/2013    History   Social History  . Marital Status: Married    Spouse Name: N/A    Number of Children: N/A  . Years of Education: N/A   Occupational History  . Not on file.   Social History Main Topics  . Smoking status: Former Games developer  . Smokeless tobacco: Not on file     Comment: 1 pk QD X 30 YRS  . Alcohol Use: Not on file  . Drug Use: Not on file  . Sexual Activity: Not on file   Other Topics Concern  . Not on file   Social History Narrative  . No narrative on file    Family History  Problem Relation Age  of Onset  . Cancer Mother     Lung ca    Current Outpatient Prescriptions  Medication Sig Dispense Refill  . Cholecalciferol (VITAMIN D-3 PO) Take by mouth.      . triamcinolone (KENALOG) 0.1 % paste        No current facility-administered medications for this visit.     No Known Allergies  BP 116/70  Pulse 72  Resp 18  Ht 5\' 5"  (1.651 m)  Wt 126 lb (57.153 kg)  BMI 20.97 kg/m2  No results found.   Review of Systems  Constitutional: Negative for fever, chills and diaphoresis.  HENT: Negative for ear pain, sore throat and trouble swallowing.   Eyes: Negative for photophobia and visual disturbance.  Respiratory: Negative for cough and choking.   Cardiovascular: Negative for chest pain and palpitations.  Gastrointestinal: Positive for rectal pain. Negative for nausea, vomiting, abdominal pain, diarrhea, constipation, blood in stool, abdominal distention and anal bleeding.  Genitourinary: Negative for dysuria, frequency and difficulty urinating.  Musculoskeletal: Negative for gait problem and myalgias.  Skin: Negative for color change, pallor and rash.  Neurological: Negative for dizziness, speech difficulty, weakness and numbness.  Hematological: Negative for adenopathy.  Psychiatric/Behavioral: Negative for confusion and agitation. The patient is not nervous/anxious.        Objective:   Physical Exam  Constitutional: She is  oriented to person, place, and time. She appears well-developed and well-nourished. No distress.  HENT:  Head: Normocephalic.  Mouth/Throat: Oropharynx is clear and moist. No oropharyngeal exudate.  Eyes: Conjunctivae and EOM are normal. Pupils are equal, round, and reactive to light. No scleral icterus.  Neck: Normal range of motion. No tracheal deviation present.  Cardiovascular: Normal rate and intact distal pulses.   Pulmonary/Chest: Effort normal. No respiratory distress. She exhibits no tenderness.  Abdominal: Soft. She exhibits no  distension. There is no tenderness. Hernia confirmed negative in the right inguinal area and confirmed negative in the left inguinal area.  Incisions clean with normal healing ridges.  No hernias  Genitourinary: No vaginal discharge found.  Exam done with assistance of female Medical Assistant in the room.  Perianal skin clean with good hygiene.  No pruritis.  No pilonidal disease.  No fissure.  No abscess/fistula.  Normal sphincter tone.  Sensitive but tolerates digital and anoscopic rectal exam.  No rectal masses.   Right anterior/hemorrhoids with .  Less prolapse.  Some external hemorrhoid component as well.  Left lateral pile not enlarged     Musculoskeletal: Normal range of motion. She exhibits no tenderness.  Lymphadenopathy:       Right: No inguinal adenopathy present.       Left: No inguinal adenopathy present.  Neurological: She is alert and oriented to person, place, and time. No cranial nerve deficit. She exhibits normal muscle tone. Coordination normal.  Skin: Skin is warm and dry. No rash noted. She is not diaphoretic.  Psychiatric: She has a normal mood and affect. Her behavior is normal.       Assessment:     Internal hemorrhoids with mild prolapse.  Improved overall but still symptomatic.     Plan:     After the patient options.  I think that she would benefit from surgery.  She would like to try banding one more time.  Proceeded:   The anatomy & physiology of the anorectal region was discussed.  The pathophysiology of hemorrhoids and differential diagnosis was discussed.  Natural history progression  was discussed.   I stressed the importance of a bowel regimen to have daily soft bowel movements to minimize progression of disease.     The patient's symptoms are not adequately controlled.  Therefore, I recommended banding to treat the hemorrhoids.  I went over the technique, risks, benefits, and alternatives.   Goals of post-operative recovery were discussed as well.   Questions were answered.  The patient expressed understanding & wished to proceed.  The patient was positioned in the lateral decubitus position.  Perianal & rectal examination was done.  Using anoscopy, I ligated above the hemorrhoids (right anterior & posterior) above the dentate line with banding.  The patient tolerated the procedure well.  Educational handouts further explaining the pathology, treatment options, and bowel regimen were given as well.   If this does not succeed, plan THD hemorrhoidal ligation/excision with possible removal of the external hemorrhoid tissue

## 2017-03-28 DIAGNOSIS — Z0000 Encounter for general adult medical examination without abnormal findings: Secondary | ICD-10-CM | POA: Diagnosis not present

## 2017-03-28 DIAGNOSIS — R5383 Other fatigue: Secondary | ICD-10-CM | POA: Diagnosis not present

## 2017-03-28 DIAGNOSIS — R682 Dry mouth, unspecified: Secondary | ICD-10-CM | POA: Diagnosis not present

## 2017-08-28 DIAGNOSIS — H52223 Regular astigmatism, bilateral: Secondary | ICD-10-CM | POA: Diagnosis not present

## 2017-08-28 DIAGNOSIS — H524 Presbyopia: Secondary | ICD-10-CM | POA: Diagnosis not present

## 2017-08-28 DIAGNOSIS — H5203 Hypermetropia, bilateral: Secondary | ICD-10-CM | POA: Diagnosis not present

## 2017-09-12 DIAGNOSIS — H52223 Regular astigmatism, bilateral: Secondary | ICD-10-CM | POA: Diagnosis not present

## 2020-06-16 ENCOUNTER — Other Ambulatory Visit: Payer: Self-pay | Admitting: Obstetrics and Gynecology

## 2020-06-16 DIAGNOSIS — Z720 Tobacco use: Secondary | ICD-10-CM

## 2020-07-04 ENCOUNTER — Other Ambulatory Visit: Payer: Self-pay

## 2020-07-04 ENCOUNTER — Ambulatory Visit
Admission: RE | Admit: 2020-07-04 | Discharge: 2020-07-04 | Disposition: A | Discharge status: Home / Self Care | Payer: Commercial Managed Care - PPO | Source: Ambulatory Visit | Attending: Obstetrics and Gynecology | Admitting: Obstetrics and Gynecology

## 2020-07-04 DIAGNOSIS — Z720 Tobacco use: Secondary | ICD-10-CM

## 2020-07-07 ENCOUNTER — Telehealth: Payer: Self-pay

## 2020-07-07 NOTE — Telephone Encounter (Signed)
Notes on file from [facility]. [phone]. Referral notes sent to scheduling.

## 2020-07-11 ENCOUNTER — Telehealth: Payer: Self-pay

## 2020-07-11 NOTE — Telephone Encounter (Signed)
Pt needs to be scheduled as soon as possible. Progress notes on file sent from:   [facility], [place] [phone]. Copy of referral order sent to scheduling

## 2020-07-14 ENCOUNTER — Telehealth: Payer: Self-pay

## 2020-07-14 NOTE — Telephone Encounter (Signed)
Notes faxed to [facility]

## 2020-07-20 ENCOUNTER — Encounter: Payer: Self-pay | Admitting: *Deleted

## 2020-07-20 ENCOUNTER — Encounter: Payer: Self-pay | Admitting: Cardiology

## 2020-07-20 DIAGNOSIS — Z87410 Personal history of cervical dysplasia: Secondary | ICD-10-CM

## 2020-07-20 HISTORY — DX: Personal history of cervical dysplasia: Z87.410

## 2020-08-16 NOTE — Progress Notes (Signed)
Cardiology Office Note:    Date:  08/17/2020   ID:  [name], DOB October 11, 1965, MRN [id number]  PCP:  Cathy Chimera, MD  Cardiologist:  Cathy Herrlich, MD   Referring MD: Cathy Chimera, MD  ASSESSMENT:    1. Coronary artery calcification seen on CT scan   2. Thoracic aortic atherosclerosis (HCC)   3. Current smoker   4. Mixed hyperlipidemia    PLAN:    In order of problems listed above:  She is at increased cardiac risk with her coronary calcification family history of premature CAD and cigarette smoking.  She will undergo cardiac CTA for risk assessment initiate lipid-lowering therapy and follow-up in the office in about 6 weeks to review the results and also reassess her blood pressure and her success in smoking cessation at follow-up lipid profile.  Next appointment 6 weeks   Medication Adjustments/Labs and Tests Ordered: Current medicines are reviewed at length with the patient today.  Concerns regarding medicines are outlined above.  No orders of the defined types were placed in this encounter.  No orders of the defined types were placed in this encounter.    Chief Complaint  Patient presents with   Follow-up    Cardiac coronary artery calcification on CT neck    History of Present Illness:    [name] is a 55 y.o. female who is being seen today for the evaluation of coronary artery calcification on CT scan low-dose for lung cancer screening 07/06/2018.  At the request of Cathy Chimera, MD. other findings included mild centrilobular emphysema several small pulmonary nodules there is also aortic atherosclerosis.  She is a current smoker 40-pack-year history.  Recent lipid profile 07/06/2020 shows a cholesterol 273 LDL of 186 triglycerides 124 HDL 66.  CMP shows a creatinine 0.73 GFR normal 97 cc liver function test were normal potassium 4.9.  Her sister is a patient of mine with premature CAD in her mother had a myocardial infarction in her  40s. She continues to smoke but seems committed to cessation. Blood pressure consistently is in range when she is outside of doctors offices. She is not having chest pain shortness of breath palpitation or syncope and has no history of congenital rheumatic heart disease. We discussed the importance of finding coronary artery calcification which is a high risk marker in view of her significant hyperlipidemia we will initiate lipid-lowering therapy with rosuvastatin. For further evaluation cardiac CTA. She has no history of congenital rheumatic heart disease Past Medical History:  Diagnosis Date   ANXIETY 05/12/2007   Qualifier: Diagnosis of  By: Genelle Gather CMA, [place]     DYSPNEA 05/12/2007   Qualifier: History of  By: Genelle Gather CMA, [place]     History of cervical dysplasia 07/20/2020   Internal hemorrhoids with pain & irritation 02/20/2013   Tobacco abuse 02/20/2013    Past Surgical History:  Procedure Laterality Date   BREAST ENHANCEMENT SURGERY  07/2010   HEMORRHOID BANDING  02/20/2013   VULVAR LESION REMOVAL  Jan 2015   Dr [name]    Current Medications: No outpatient medications have been marked as taking for the 08/17/20 encounter (Office Visit) with Baldo Daub, MD.     Allergies:   Patient has no known allergies.   Social History   Socioeconomic History   Marital status: Married    Spouse name: Not on file   Number of children: Not on file   Years of education: Not on file   Highest education level:  Not on file  Occupational History   Not on file  Tobacco Use   Smoking status: Every Day    Packs/day: 1.00    Years: 38.00    Pack years: 38.00    Types: Cigarettes   Smokeless tobacco: Never   Tobacco comments:    1 pk QD X 30 YRS  Substance and Sexual Activity   Alcohol use: Yes    Comment: Occasional   Drug use: Not on file   Sexual activity: Not Currently  Other Topics Concern   Not on file  Social History Narrative   Not on file   Social Determinants of  Health   Financial Resource Strain: Not on file  Food Insecurity: Not on file  Transportation Needs: Not on file  Physical Activity: Not on file  Stress: Not on file  Social Connections: Not on file     Family History: The patient's family history includes Diabetes in her father and maternal aunt; Lung cancer in her maternal grandmother and mother; Stomach cancer in her maternal aunt.  ROS:   ROS Please see the history of present illness.     All other systems reviewed and are negative.  EKGs/Labs/Other Studies Reviewed:    The following studies were reviewed today:   EKG:  EKG is  ordered today.  The ekg ordered today is personally reviewed and demonstrates sinus rhythm and is normal   Physical Exam:    VS:  BP (!) 150/86 (BP Location: Right Arm, Patient Position: Sitting, Cuff Size: Normal)   Pulse 82   Ht 5\' 5"  (1.651 m)   Wt 168 lb 6.4 oz (76.4 kg)   SpO2 96%   BMI 28.02 kg/m     Wt Readings from Last 3 Encounters:  08/17/20 168 lb 6.4 oz (76.4 kg)  07/06/20 168 lb (76.2 kg)  06/17/13 126 lb (57.2 kg)     GEN:  Well nourished, well developed in no acute distress HEENT: Normal NECK: No JVD; No carotid bruits LYMPHATICS: No lymphadenopathy CARDIAC: RRR, no murmurs, rubs, gallops RESPIRATORY:  Clear to auscultation without rales, wheezing or rhonchi  ABDOMEN: Soft, non-tender, non-distended MUSCULOSKELETAL:  No edema; No deformity  SKIN: Warm and dry she has no xanthoma or xanthelasma NEUROLOGIC:  Alert and oriented x 3 PSYCHIATRIC:  Normal affect     Signed, 06/19/13, MD  08/17/2020 2:11 PM    [facility]

## 2020-08-17 ENCOUNTER — Encounter: Payer: Self-pay | Admitting: Cardiology

## 2020-08-17 ENCOUNTER — Ambulatory Visit (INDEPENDENT_AMBULATORY_CARE_PROVIDER_SITE_OTHER): Payer: Commercial Managed Care - PPO | Admitting: Cardiology

## 2020-08-17 ENCOUNTER — Other Ambulatory Visit: Payer: Self-pay

## 2020-08-17 VITALS — BP 150/86 | HR 82 | Ht 65.0 in | Wt 168.4 lb

## 2020-08-17 DIAGNOSIS — I700 Atherosclerosis of aorta: Secondary | ICD-10-CM

## 2020-08-17 DIAGNOSIS — I2510 Atherosclerotic heart disease of native coronary artery without angina pectoris: Secondary | ICD-10-CM | POA: Diagnosis not present

## 2020-08-17 DIAGNOSIS — F17200 Nicotine dependence, unspecified, uncomplicated: Secondary | ICD-10-CM

## 2020-08-17 DIAGNOSIS — E782 Mixed hyperlipidemia: Secondary | ICD-10-CM | POA: Diagnosis not present

## 2020-08-17 MED ORDER — ROSUVASTATIN CALCIUM 10 MG PO TABS: 10.0000 mg | ORAL_TABLET | Freq: Every day | ORAL | 3 refills | Status: DC

## 2020-08-17 MED ORDER — METOPROLOL TARTRATE 100 MG PO TABS: 100.0000 mg | ORAL_TABLET | Freq: Once | ORAL | 0 refills | Status: DC

## 2020-08-17 NOTE — Patient Instructions (Signed)
Medication Instructions:  Your physician has recommended you make the following change in your medication:  START: Rosuvastatin 10 mg take one tablet by mouth daily.  *If you need a refill on your cardiac medications before your next appointment, please call your pharmacy*   Lab Work: Your physician recommends that you return for lab work in: Within one week of your cardiac CT  BMP   If you have labs (blood work) drawn today and your tests are completely normal, you will receive your results only by: MyChart Message (if you have MyChart) OR A paper copy in the mail If you have any lab test that is abnormal or we need to change your treatment, we will call you to review the results.   Testing/Procedures:   Your cardiac CT will be scheduled at the below location:   [facility] [address] [place], [place] [address] [phone]  If scheduled at [facility], please arrive at the [facility] main entrance (entrance A) of [facility] 30 minutes prior to test start time. Proceed to the [facility] Radiology Department (first floor) to check-in and test prep.  Please follow these instructions carefully (unless otherwise directed):  On the Night Before the Test: Be sure to Drink plenty of water. Do not consume any caffeinated/decaffeinated beverages or chocolate 12 hours prior to your test. Do not take any antihistamines 12 hours prior to your test.  On the Day of the Test: Drink plenty of water until 1 hour prior to the test. Do not eat any food 4 hours prior to the test. You may take your regular medications prior to the test.  Take metoprolol (Lopressor) two hours prior to test. FEMALES- please wear underwire-free bra if available, avoid dresses & tight clothing      After the Test: Drink plenty of water. After receiving IV contrast, you may experience a mild flushed feeling. This is normal. On occasion, you may experience a mild rash up to 24  hours after the test. This is not dangerous. If this occurs, you can take Benadryl 25 mg and increase your fluid intake. If you experience trouble breathing, this can be serious. If it is severe call 911 IMMEDIATELY. If it is mild, please call our office. If you take any of these medications: Glipizide/Metformin, Avandament, Glucavance, please do not take 48 hours after completing test unless otherwise instructed.  Please allow 2-4 weeks for scheduling of routine cardiac CTs. Some insurance companies require a pre-authorization which may delay scheduling of this test.   For non-scheduling related questions, please contact the cardiac imaging nurse navigator should you have any questions/concerns: Rockwell Alexandria, Cardiac Imaging Nurse Navigator Larey Brick, Cardiac Imaging Nurse Navigator [facility] Direct Office Dial: [phone]   For scheduling needs, including cancellations and rescheduling, please call [place], [phone].    Follow-Up: At [facility], you and your health needs are our priority.  As part of our continuing mission to provide you with exceptional heart care, we have created designated Provider Care Teams.  These Care Teams include your primary Cardiologist (physician) and Advanced Practice Providers (APPs -  Physician Assistants and Nurse Practitioners) who all work together to provide you with the care you need, when you need it.  We recommend signing up for the patient portal called "MyChart".  Sign up information is provided on this After Visit Summary.  MyChart is used to connect with patients for Virtual Visits (Telemedicine).  Patients are able  to view lab/test results, encounter notes, upcoming appointments, etc.  Non-urgent messages can be sent to your provider as well.   To learn more about what you can do with MyChart, go to [email or web address].    Your next appointment:   6 week(s)  The format for your next appointment:    In Person  Provider:   Norman Herrlich, MD   Other Instructions

## 2020-08-22 ENCOUNTER — Other Ambulatory Visit: Payer: Self-pay

## 2020-08-22 DIAGNOSIS — F17200 Nicotine dependence, unspecified, uncomplicated: Secondary | ICD-10-CM

## 2020-08-22 DIAGNOSIS — I2510 Atherosclerotic heart disease of native coronary artery without angina pectoris: Secondary | ICD-10-CM

## 2020-08-22 DIAGNOSIS — I700 Atherosclerosis of aorta: Secondary | ICD-10-CM

## 2020-08-22 DIAGNOSIS — E782 Mixed hyperlipidemia: Secondary | ICD-10-CM

## 2020-08-23 LAB — BASIC METABOLIC PANEL
BUN/Creatinine Ratio: 11 (ref 9–23)
BUN: 8 mg/dL (ref 6–24)
CO2: 21 mmol/L (ref 20–29)
Calcium: 9.5 mg/dL (ref 8.7–10.2)
Chloride: 105 mmol/L (ref 96–106)
Creatinine, Ser: 0.72 mg/dL (ref 0.57–1.00)
Glucose: 92 mg/dL (ref 65–99)
Potassium: 4.9 mmol/L (ref 3.5–5.2)
Sodium: 142 mmol/L (ref 134–144)
eGFR: 98 mL/min/{1.73_m2} (ref >59)

## 2020-08-24 ENCOUNTER — Telehealth (HOSPITAL_COMMUNITY): Payer: Self-pay | Admitting: *Deleted

## 2020-08-24 NOTE — Telephone Encounter (Signed)
Reaching out to patient to offer assistance regarding upcoming cardiac imaging study; pt verbalizes understanding of appt date/time, parking situation and where to check in, pre-test NPO status and medications ordered, and verified current allergies; name and call back number provided for further questions should they arise 

## 2020-08-25 ENCOUNTER — Telehealth: Payer: Self-pay

## 2020-08-25 ENCOUNTER — Other Ambulatory Visit: Payer: Self-pay

## 2020-08-25 ENCOUNTER — Ambulatory Visit (HOSPITAL_COMMUNITY)
Admission: RE | Admit: 2020-08-25 | Discharge: 2020-08-25 | Disposition: A | Discharge status: Home / Self Care | Payer: Commercial Managed Care - PPO | Source: Ambulatory Visit | Attending: Cardiology | Admitting: Cardiology

## 2020-08-25 DIAGNOSIS — I2510 Atherosclerotic heart disease of native coronary artery without angina pectoris: Secondary | ICD-10-CM | POA: Insufficient documentation

## 2020-08-25 MED ORDER — NITROGLYCERIN 0.4 MG SL SUBL
0.8000 mg | SUBLINGUAL_TABLET | Freq: Once | SUBLINGUAL | Status: AC
  Administered 2020-08-25: 0.8 mg via SUBLINGUAL

## 2020-08-25 MED ORDER — IOHEXOL 350 MG/ML SOLN
100.0000 mL | Freq: Once | INTRAVENOUS | Status: AC | PRN
  Administered 2020-08-25: 100 mL via INTRAVENOUS

## 2020-08-25 MED ORDER — NITROGLYCERIN 0.4 MG SL SUBL
SUBLINGUAL_TABLET | SUBLINGUAL | Status: DC
Start: 2020-08-25 — End: 2020-08-26
  Filled 2020-08-25: qty 2

## 2020-08-25 NOTE — Telephone Encounter (Signed)
Spoke with patient regarding results and recommendation.  Patient verbalizes understanding and is agreeable to plan of care. Advised patient to call back with any issues or concerns.  

## 2020-08-25 NOTE — Telephone Encounter (Signed)
-----   Message from Baldo Daub, MD sent at 08/25/2020  3:27 PM EDT ----- Normal or stable result  Overall good results although the calcium score is high she is on a statin should continue the same and she has minimal atherosclerosis of one of her arteries not affecting blood flow.

## 2020-10-06 ENCOUNTER — Encounter: Payer: Self-pay | Admitting: Cardiology

## 2020-10-06 ENCOUNTER — Ambulatory Visit (INDEPENDENT_AMBULATORY_CARE_PROVIDER_SITE_OTHER): Payer: Commercial Managed Care - PPO | Admitting: Cardiology

## 2020-10-06 ENCOUNTER — Other Ambulatory Visit: Payer: Self-pay

## 2020-10-06 VITALS — BP 144/84 | HR 90 | Ht 65.0 in | Wt 172.0 lb

## 2020-10-06 DIAGNOSIS — I2510 Atherosclerotic heart disease of native coronary artery without angina pectoris: Secondary | ICD-10-CM | POA: Diagnosis not present

## 2020-10-06 DIAGNOSIS — E782 Mixed hyperlipidemia: Secondary | ICD-10-CM

## 2020-10-06 DIAGNOSIS — F17200 Nicotine dependence, unspecified, uncomplicated: Secondary | ICD-10-CM | POA: Diagnosis not present

## 2020-10-06 DIAGNOSIS — R931 Abnormal findings on diagnostic imaging of heart and coronary circulation: Secondary | ICD-10-CM | POA: Diagnosis not present

## 2020-10-06 NOTE — Patient Instructions (Signed)

## 2020-10-06 NOTE — Progress Notes (Signed)
Cardiology Office Note:    Date:  10/06/2020   ID:  [name], DOB 1965/09/07, MRN [id number]  PCP:  Cathy Chimera, MD  Cardiologist:  Norman Herrlich, MD    Referring MD: Cathy Chimera, MD    ASSESSMENT:    1. Agatston coronary artery calcium score between 100 and 199   2. Atherosclerosis of native coronary artery of native heart without angina pectoris   3. Current smoker   4. Mixed hyperlipidemia    PLAN:    In order of problems listed above:  She has good healthcare literacy understands the results of her CTA and is committed to lifestyle change as well as medical treatment with daily aspirin and high intensity statin. Recheck her lipid profile her LDL suggest familial hyperlipidemia also check LP(a) level goal of LDL hearing should be less than 100 ideally less than 70   Next appointment: I will see him back in my office as needed in the future.   Medication Adjustments/Labs and Tests Ordered: Current medicines are reviewed at length with the patient today.  Concerns regarding medicines are outlined above.  No orders of the defined types were placed in this encounter.  No orders of the defined types were placed in this encounter.   Chief Complaint  Patient presents with   Follow-up    After cardiac CTA     History of Present Illness:    [name] is a 54 y.o. female with a hx of coronary artery calcification lung cancer screening CT scan, mild emphysema small pulmonary nodules aortic atherosclerosis and family history of premature CAD with ongoing cigarette smoking last seen since 08/17/2020.  Compliance with diet, lifestyle and medications: Yes    Continues to have ongoing results of her cardiac CTA her calcium score is quite high greater than 198 percentile and she has minimal coronary atherosclerosis.  She is taking a statin and tolerating it and is motivated to lifestyle change and tells me she takes at least 1 Goody powder a day  continue aspirin.  She is having no muscle pain or weakness no anginal discomfort.  She underwent cardiac CTA reported 08/25/2020.  Her coronary artery calcium score was 118 which is 95th percentile for matched controls and she had minimal calcified plaque less than 25% in the proximal left anterior descending coronary artery and other cardiovascular structures were normal. Past Medical History:  Diagnosis Date   ANXIETY 05/12/2007   Qualifier: Diagnosis of  By: Genelle Gather CMA, [place]     DYSPNEA 05/12/2007   Qualifier: History of  By: Genelle Gather CMA, [place]     History of cervical dysplasia 07/20/2020   Internal hemorrhoids with pain & irritation 02/20/2013   Tobacco abuse 02/20/2013    Past Surgical History:  Procedure Laterality Date   BREAST ENHANCEMENT SURGERY  07/2010   HEMORRHOID BANDING  02/20/2013   VULVAR LESION REMOVAL  Jan 2015   Dr [name]    Current Medications: Current Meds  Medication Sig   Cholecalciferol (VITAMIN D-3 PO) Take 1 tablet by mouth daily. Strength unknown   clobetasol cream (TEMOVATE) 0.05 % Apply 1 application topically 2 (two) times daily as needed for rash.   rosuvastatin (CRESTOR) 10 MG tablet Take 1 tablet (10 mg total) by mouth daily.   vitamin C (ASCORBIC ACID) 500 MG tablet Take 500 mg by mouth daily.     Allergies:   Patient has no known allergies.   Social History   Socioeconomic History   Marital status:  Married    Spouse name: Not on file   Number of children: Not on file   Years of education: Not on file   Highest education level: Not on file  Occupational History   Not on file  Tobacco Use   Smoking status: Every Day    Packs/day: 1.00    Years: 38.00    Pack years: 38.00    Types: Cigarettes   Smokeless tobacco: Never   Tobacco comments:    1 pk QD X 30 YRS  Substance and Sexual Activity   Alcohol use: Yes    Comment: Occasional   Drug use: Not on file   Sexual activity: Not Currently  Other Topics Concern   Not on file   Social History Narrative   Not on file   Social Determinants of Health   Financial Resource Strain: Not on file  Food Insecurity: Not on file  Transportation Needs: Not on file  Physical Activity: Not on file  Stress: Not on file  Social Connections: Not on file     Family History: The patient's family history includes Diabetes in her father and maternal aunt; Lung cancer in her maternal grandmother and mother; Stomach cancer in her maternal aunt. ROS:   Please see the history of present illness.    All other systems reviewed and are negative.  EKGs/Labs/Other Studies Reviewed:    The following studies were reviewed today:    Recent Labs: 08/22/2020: BUN 8; Creatinine, Ser 0.72; Potassium 4.9; Sodium 142  Recent Lipid Panel 07/06/2020 LDL cholesterol 186 HDL 65 cholesterol 273 triglycerides strength  Physical Exam:    VS:  BP (!) 144/84 (BP Location: Left Arm, Patient Position: Sitting, Cuff Size: Normal)   Pulse 90   Ht 5\' 5"  (1.651 m)   Wt 172 lb (78 kg)   SpO2 99%   BMI 28.62 kg/m     Wt Readings from Last 3 Encounters:  10/06/20 172 lb (78 kg)  08/17/20 168 lb 6.4 oz (76.4 kg)  07/06/20 168 lb (76.2 kg)     GEN: She has no xanthoma or xanthelasma well nourished, well developed in no acute distress HEENT: Normal NECK: No JVD; No carotid bruits LYMPHATICS: No lymphadenopathy CARDIAC: RRR, no murmurs, rubs, gallops RESPIRATORY:  Clear to auscultation without rales, wheezing or rhonchi  ABDOMEN: Soft, non-tender, non-distended MUSCULOSKELETAL:  No edema; No deformity  SKIN: Warm and dry NEUROLOGIC:  Alert and oriented x 3 PSYCHIATRIC:  Normal affect    Signed, 07/08/20, MD  10/06/2020 8:43 AM    [facility]

## 2020-10-07 LAB — LIPID PANEL
Chol/HDL Ratio: 3.5 ratio (ref 0.0–4.4)
Cholesterol, Total: 184 mg/dL (ref 100–199)
HDL: 53 mg/dL (ref >39)
LDL Chol Calc (NIH): 111 mg/dL — ABNORMAL HIGH (ref 0–99)
Triglycerides: 114 mg/dL (ref 0–149)
VLDL Cholesterol Cal: 20 mg/dL (ref 5–40)

## 2020-10-07 LAB — LIPOPROTEIN A (LPA): Lipoprotein (a): 42 nmol/L (ref <75.0)

## 2020-10-10 ENCOUNTER — Telehealth: Payer: Self-pay

## 2020-10-10 MED ORDER — ROSUVASTATIN CALCIUM 20 MG PO TABS: 20.0000 mg | ORAL_TABLET | Freq: Every day | ORAL | 3 refills | Status: DC

## 2020-10-10 NOTE — Telephone Encounter (Signed)
-----   Message from Thomasene Ripple, DO sent at 10/09/2020  7:43 PM EDT ----- This is Dr. Hulen Shouts patient, your LDL ideally should be less than 70 it is now 111.  Increase Crestor to 20 mg daily.

## 2020-10-10 NOTE — Telephone Encounter (Signed)
Spoke with patient regarding results and recommendation.  Patient verbalizes understanding and is agreeable to plan of care. Advised patient to call back with any issues or concerns.  

## 2021-10-02 ENCOUNTER — Other Ambulatory Visit: Payer: Self-pay | Admitting: Obstetrics and Gynecology

## 2021-10-02 DIAGNOSIS — Z720 Tobacco use: Secondary | ICD-10-CM

## 2021-10-07 LAB — COLOGUARD
COLOGUARD: NEGATIVE
Cologuard: NEGATIVE

## 2021-10-24 ENCOUNTER — Ambulatory Visit
Admission: RE | Admit: 2021-10-24 | Discharge: 2021-10-24 | Disposition: A | Discharge status: Home / Self Care | Payer: Commercial Managed Care - PPO | Source: Ambulatory Visit | Attending: Obstetrics and Gynecology | Admitting: Obstetrics and Gynecology

## 2021-10-24 DIAGNOSIS — Z720 Tobacco use: Secondary | ICD-10-CM

## 2022-09-25 LAB — HM PAP SMEAR: HM Pap smear: NEGATIVE

## 2022-09-25 LAB — HM MAMMOGRAPHY: HM Mammogram: NORMAL (ref 0–4)

## 2023-02-10 IMAGING — CT CT HEART MORP W/ CTA COR W/ SCORE W/ CA W/CM &/OR W/O CM
1 series · 12 of 20 positions shown, 15 images · non-contrast
Comparison: Lung cancer screening chest CT 07/04/2020

Addendum:
CLINICAL DATA: Chest pain

EXAM:
Cardiac/Coronary CTA
TECHNIQUE: A non-contrast, gated CT scan was obtained with axial slices of 3 mm
through the heart for calcium scoring. Calcium scoring was performed
using the Agatston method. A 110 kV prospective, gated, contrast
cardiac scan was obtained. Gantry rotation speed was 250 msecs and
collimation was 0.6 mm. Two sublingual nitroglycerin tablets (0.8
mg) were given. The 3D data set was reconstructed in 5% intervals of
the 35-75% of the R-R cycle. Diastolic phases were analyzed on a
dedicated workstation using MPR, MIP, and VRT modes. The patient
received 95 cc of contrast.

[Series 1206: findings · 12 of 24 slices shown, 15 images]
[im 2/24  vessel]
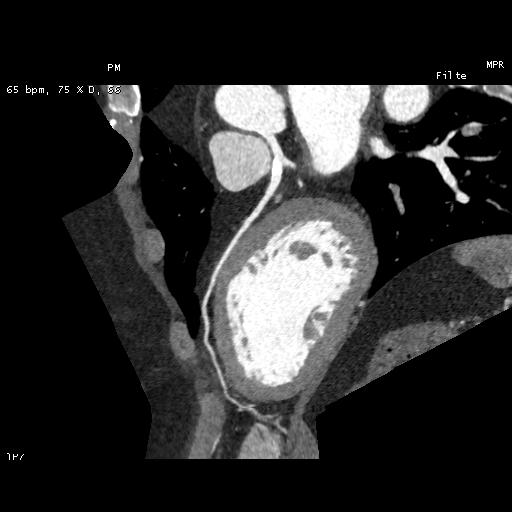
[im 2/24  lung]
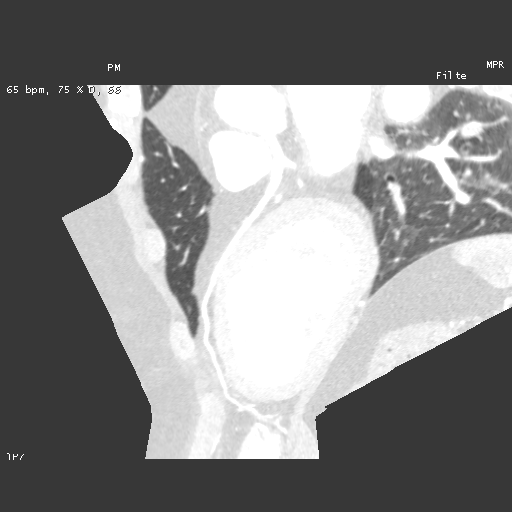
[im 4/24  vessel]
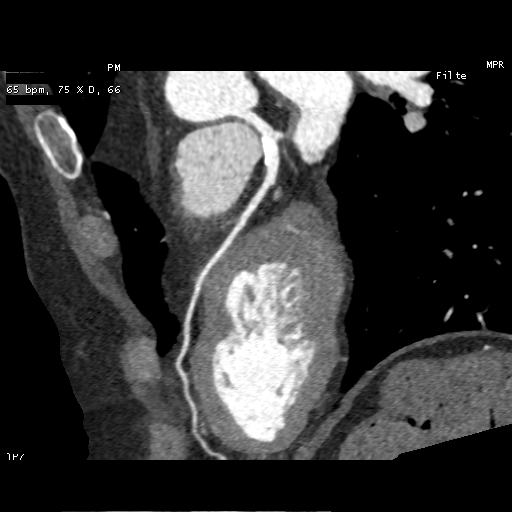
[im 5/24  vessel]
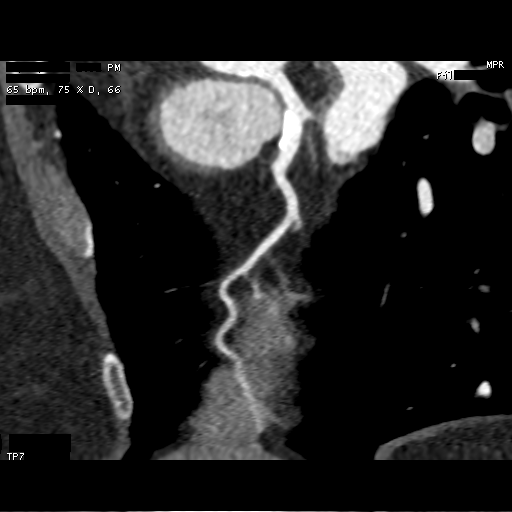
[im 8/24  vessel]
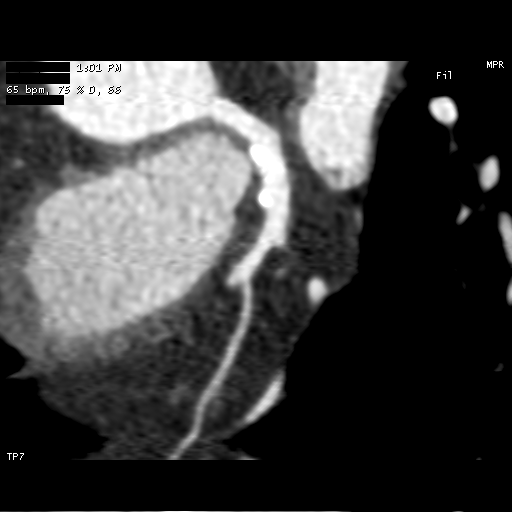
[im 9/24  vessel]
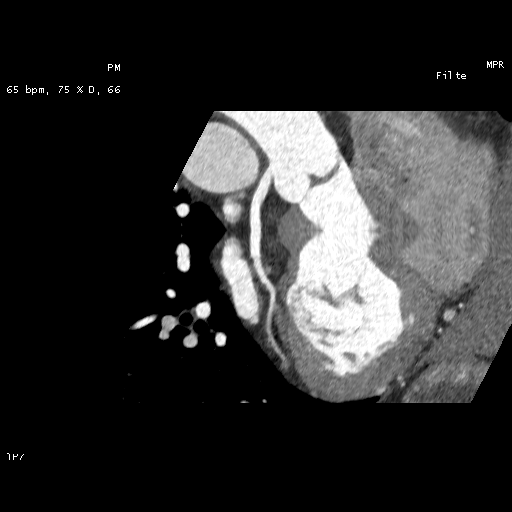
[im 9/24  lung]
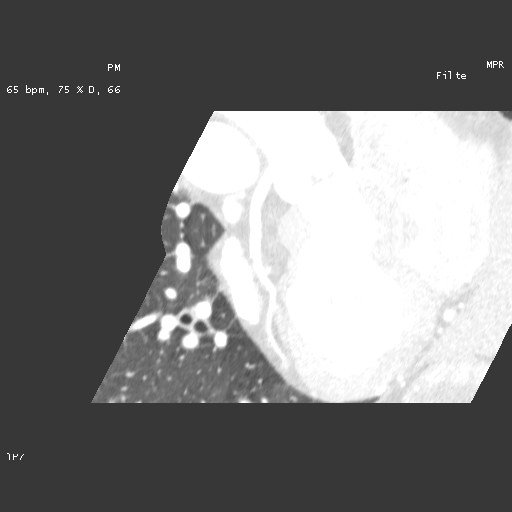
[im 11/24  vessel]
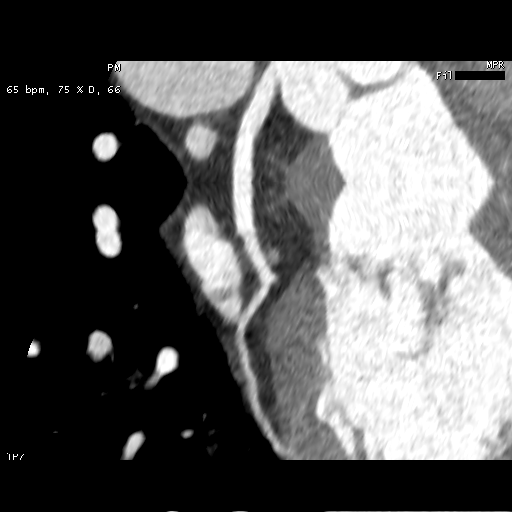
[im 13/24  vessel]
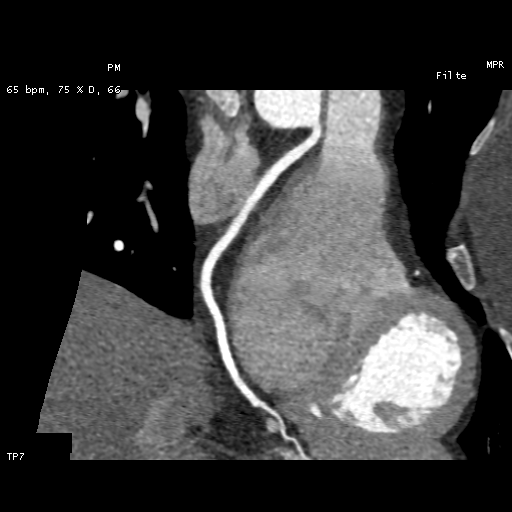
[im 15/24  vessel]
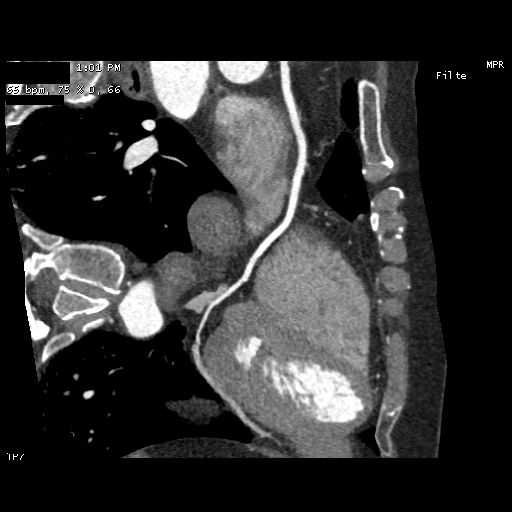
[im 16/24  vessel]
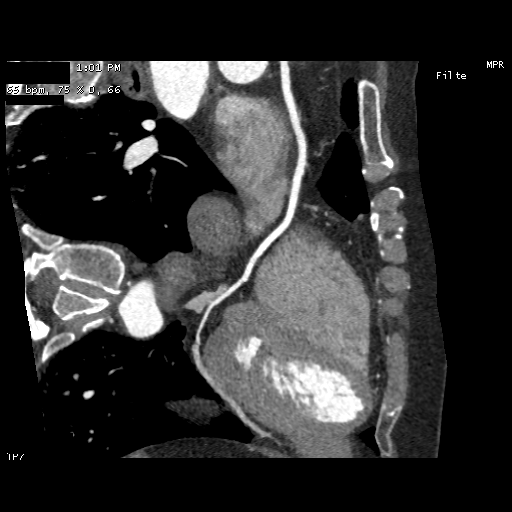
[im 16/24  lung]
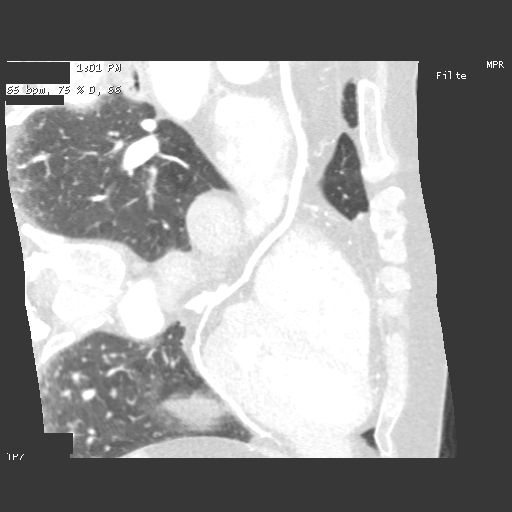
[im 19/24  vessel]
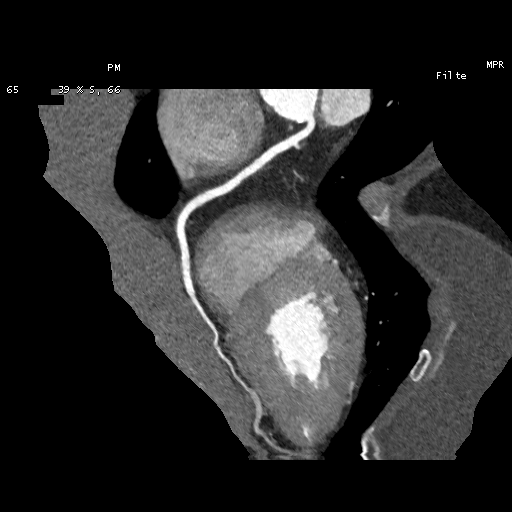
[im 20/24  vessel]
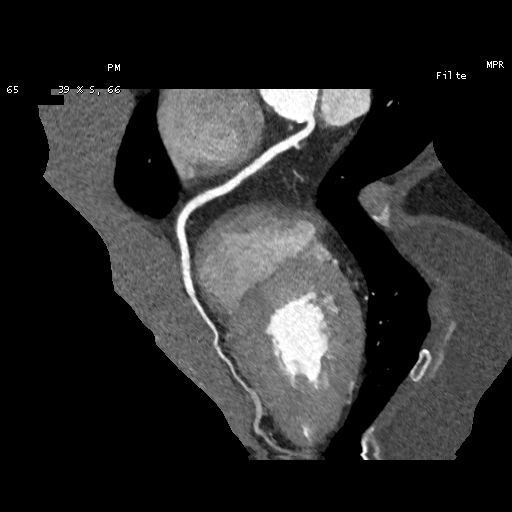
[im 22/24  vessel]
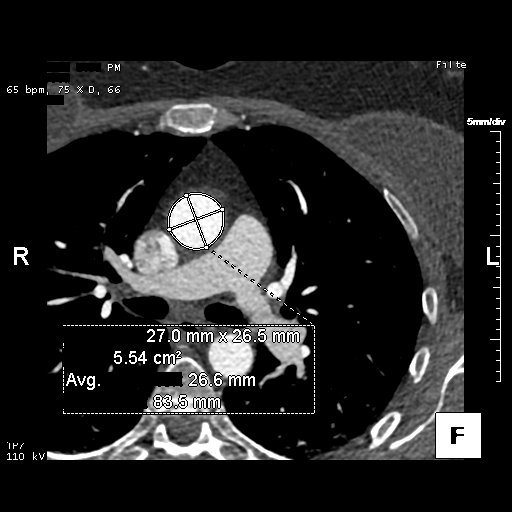

[12 of 20 positions shown; findings below may reference images not displayed]

FINDINGS: Image quality: Excellent.

Noise artifact is: Limited.

Coronary Arteries:  Normal coronary origin.  Right dominance.

Left main: The left main is a large caliber vessel with a normal
take off from the left coronary cusp that bifurcates to form a left
anterior descending artery and a left circumflex artery. There is no
plaque or stenosis.

Left anterior descending artery: The proximal LAD contains minimal
calcified plaque (<25%). The mid and distal segments are patent. The
LAD gives off 2 patent diagonal branches.

Left circumflex artery: The LCX is non-dominant and patent with no
evidence of plaque or stenosis. The LCX gives off 1 patent obtuse
marginal branch.

Right coronary artery: The RCA is dominant with normal take off from
the right coronary cusp. There is no evidence of plaque or stenosis.
The RCA terminates as a PDA and right posterolateral branch without
evidence of plaque or stenosis.

Right Atrium: Right atrial size is within normal limits.

Right Ventricle: The right ventricular cavity is within normal
limits.

Left Atrium: Left atrial size is normal in size with no left atrial
appendage filling defect.

Left Ventricle: The ventricular cavity size is within normal limits.
There are no stigmata of prior infarction. There is no abnormal
filling defect.

Pulmonary arteries: Normal in size without proximal filling defect.

Pulmonary veins: Normal pulmonary venous drainage.

Pericardium: Normal thickness with no significant effusion or
calcium present.

Cardiac valves: The aortic valve is trileaflet without significant
calcification. The mitral valve is normal structure without
significant calcification.

Aorta: Normal caliber with no significant disease.

Extra-cardiac findings: See attached radiology report for
non-cardiac structures.
IMPRESSION: 1. Coronary calcium score of 118. This was 95th percentile for age-,
sex, and race-matched controls.

2. Normal coronary origin with right dominance.

3. Minimal calcified plaque (<25%) in the proximal LAD.

RECOMMENDATIONS:
1. Minimal non-obstructive CAD (0-24%). Consider non-atherosclerotic
causes of chest pain. Consider preventive therapy and risk factor
modification.

EXAM:
OVER-READ INTERPRETATION  CT CHEST

The following report is an over-read performed by radiologist Dr.
Keya Koon [REDACTED] on 08/25/2020. This over-read
does not include interpretation of cardiac or coronary anatomy or
pathology. The coronary CTA interpretation by the cardiologist is
attached.
FINDINGS: The visualized portions of the lower lung fields show no suspicious
nodules, masses, or infiltrates. No pleural fluid seen.

The visualized portions of the mediastinum and chest wall are
unremarkable.
IMPRESSION: No significant non-cardiovascular abnormality seen in visualized
portion of the thorax.

*** End of Addendum ***
FINDINGS: Image quality: Excellent.

Noise artifact is: Limited.

Coronary Arteries:  Normal coronary origin.  Right dominance.

Left main: The left main is a large caliber vessel with a normal
take off from the left coronary cusp that bifurcates to form a left
anterior descending artery and a left circumflex artery. There is no
plaque or stenosis.

Left anterior descending artery: The proximal LAD contains minimal
calcified plaque (<25%). The mid and distal segments are patent. The
LAD gives off 2 patent diagonal branches.

Left circumflex artery: The LCX is non-dominant and patent with no
evidence of plaque or stenosis. The LCX gives off 1 patent obtuse
marginal branch.

Right coronary artery: The RCA is dominant with normal take off from
the right coronary cusp. There is no evidence of plaque or stenosis.
The RCA terminates as a PDA and right posterolateral branch without
evidence of plaque or stenosis.

Right Atrium: Right atrial size is within normal limits.

Right Ventricle: The right ventricular cavity is within normal
limits.

Left Atrium: Left atrial size is normal in size with no left atrial
appendage filling defect.

Left Ventricle: The ventricular cavity size is within normal limits.
There are no stigmata of prior infarction. There is no abnormal
filling defect.

Pulmonary arteries: Normal in size without proximal filling defect.

Pulmonary veins: Normal pulmonary venous drainage.

Pericardium: Normal thickness with no significant effusion or
calcium present.

Cardiac valves: The aortic valve is trileaflet without significant
calcification. The mitral valve is normal structure without
significant calcification.

Aorta: Normal caliber with no significant disease.

Extra-cardiac findings: See attached radiology report for
non-cardiac structures.
IMPRESSION: 1. Coronary calcium score of 118. This was 95th percentile for age-,
sex, and race-matched controls.

2. Normal coronary origin with right dominance.

3. Minimal calcified plaque (<25%) in the proximal LAD.

RECOMMENDATIONS:
1. Minimal non-obstructive CAD (0-24%). Consider non-atherosclerotic
causes of chest pain. Consider preventive therapy and risk factor
modification.

## 2023-05-23 HISTORY — PX: ROOT CANAL: SHX2363

## 2023-09-04 HISTORY — PX: OTHER SURGICAL HISTORY: SHX169

## 2023-09-09 ENCOUNTER — Ambulatory Visit (HOSPITAL_BASED_OUTPATIENT_CLINIC_OR_DEPARTMENT_OTHER): Admitting: Student

## 2023-09-09 ENCOUNTER — Encounter (HOSPITAL_BASED_OUTPATIENT_CLINIC_OR_DEPARTMENT_OTHER): Payer: Self-pay | Admitting: Student

## 2023-09-09 ENCOUNTER — Encounter (HOSPITAL_BASED_OUTPATIENT_CLINIC_OR_DEPARTMENT_OTHER): Payer: Self-pay

## 2023-09-09 ENCOUNTER — Ambulatory Visit: Payer: Self-pay

## 2023-09-09 VITALS — BP 129/84 | HR 104 | Temp 98.6°F | Resp 16 | Ht 63.98 in | Wt 167.1 lb

## 2023-09-09 DIAGNOSIS — J320 Chronic maxillary sinusitis: Secondary | ICD-10-CM

## 2023-09-09 DIAGNOSIS — F411 Generalized anxiety disorder: Secondary | ICD-10-CM

## 2023-09-09 DIAGNOSIS — Z131 Encounter for screening for diabetes mellitus: Secondary | ICD-10-CM | POA: Diagnosis not present

## 2023-09-09 DIAGNOSIS — Z136 Encounter for screening for cardiovascular disorders: Secondary | ICD-10-CM

## 2023-09-09 DIAGNOSIS — Z7689 Persons encountering health services in other specified circumstances: Secondary | ICD-10-CM

## 2023-09-09 DIAGNOSIS — Z13220 Encounter for screening for lipoid disorders: Secondary | ICD-10-CM | POA: Diagnosis not present

## 2023-09-09 DIAGNOSIS — Z122 Encounter for screening for malignant neoplasm of respiratory organs: Secondary | ICD-10-CM

## 2023-09-09 MED ORDER — AZELASTINE HCL 0.1 % NA SOLN: 1.0000 | Freq: Two times a day (BID) | NASAL | 5 refills | Status: AC

## 2023-09-09 MED ORDER — LEVOCETIRIZINE DIHYDROCHLORIDE 5 MG PO TABS: 5.0000 mg | ORAL_TABLET | Freq: Every evening | ORAL | 2 refills | Status: DC

## 2023-09-09 NOTE — Telephone Encounter (Signed)
 FYI Only or Action Required?: FYI only for provider.  Patient was last seen in primary care on N/A, new patient.  Called Nurse Triage reporting Sinusitis.  Symptoms began several months ago.  Interventions attempted: OTC medications: Advil, flonase, Prescription medications: doxycyline, clindamycin, prednisone, amoxicillin, and Rest, hydration, or home remedies; steam/humidifier, saline nasal rinses, heat.  Symptoms are: sinus pressure/ear pressure, nasal congestion with yellow to green mucus, foul taste in mouth tastes the drainagegradually worsening.  Triage Disposition: See PCP When Office is Open (Within 3 Days)- Advised patient that her appt today is to establish with a PCP and the provider may not be able to address her acute concerns she may need to go to urgent care or set up an acute appt once established with PCP  Patient/caregiver understands and will follow disposition?: Yes         Copied from CRM #[id number]. Topic: Clinical - Red Word Triage >> Sep 09, 2023  9:19 AM Gennette ORN wrote: Red Word that prompted transfer to Nurse Triage: Patient is having facial pain , sinus issues , she can't taste and smell. She has swelling going on as well.Patient has been to urgent care and using heating pad but nothing helps. Reason for Disposition  [1] Sinus congestion (pressure, fullness) AND [2] present > 10 days  Answer Assessment - Initial Assessment Questions She states she has been on and off antibiotics (amoxicillin, doxycycline, clindamycin) as well as prednisone since May after having a root canal. Most recent antibiotic was July 26th and 10 day course of doxycycline. She states it improved then returned last week.   1. LOCATION: Where does it hurt?      Under eyes, over eyebrows, into ears and into jaw.  2. ONSET: When did the sinus pain start?  (e.g., hours, days)      On and off x 5 years  3. SEVERITY: How bad is the pain?   (Scale 0-10; or none, mild,  moderate or severe)     Throbbing, no pain right now.  4. RECURRENT SYMPTOM: Have you ever had sinus problems before? If Yes, ask: When was the last time? and What happened that time?      Yes, she has been having issues with sinuses for years.   5. NASAL CONGESTION: Is the nose blocked? If Yes, ask: Can you open it or must you breathe through your mouth?     She states her nose is stuffy but not completely clogged up.  6. NASAL DISCHARGE: Do you have discharge from your nose? If so ask, What color?     Yes, greenish-yellow.  7. FEVER: Do you have a fever? If Yes, ask: What is it, how was it measured, and when did it start?      No.  8. OTHER SYMPTOMS: Do you have any other symptoms? (e.g., sore throat, cough, earache, difficulty breathing)     Foul taste taste the drainage, pressure in face/ears. Denies sore throat, difficulty breathing.  9. PREGNANCY: Is there any chance you are pregnant? When was your last menstrual period?     N/A.  Protocols used: Sinus Pain or Congestion-A-AH

## 2023-09-09 NOTE — Patient Instructions (Signed)
 It was nice to see you today!  If you have any problems before your next visit feel free to message me via MyChart (minor issues or questions) or call the office, otherwise you may reach out to schedule an office visit.  Thank you! Pau Banh, PA-C

## 2023-09-09 NOTE — Progress Notes (Signed)
 New Patient Office Visit  Subjective    Patient ID: [name], female    DOB: [birth year]  Age: 58 y.o. MRN: [id number]  CC:  Chief Complaint  Patient presents with   Establish Care    Here to establish care.    Facial Pain    Had root canal done, got temp crown, was given antibiotics, a few weeks later, got some sinus drainage. Patient is having facial pain , sinus issues , she can't taste and smell like normal. She has swelling going on as well.Patient has been to urgent care and using heating pad but nothing helps.Headaches feel worse.     Discussed the use of AI scribe software for clinical note transcription with the patient, who gave verbal consent to proceed.  History of Present Illness   [name] is a 58 year old female who presents for establishment of care with facial pain and sinus issues.  She has experienced facial pain and sinus issues for several years, with worsening symptoms over the last few weeks. The pain manifests as sinus headaches radiating to her jaw, requiring her to lay down with a heat mask for relief. These headaches last from 30 minutes to 2 hours and are accompanied by drainage down her throat, which she can smell and taste, causing nausea.  She has been using Sudafed for the past two days to manage her symptoms but is concerned about its side effects, including dry mouth and increased heart rate. She has a history of sinus issues and has used Afrin in the past, which led to rebound congestion. Flonase has been used since July with some effect.  In May, she underwent a root canal followed by a temporary crown placement. Post-procedure, she experienced throbbing and swelling, for which she was prescribed amoxicillin. Despite this, she continues to experience drainage and has been treated with clindamycin, doxycycline, and prednisone, which provide temporary relief.  She has a history of high cholesterol, managed with rosuvastatin ,  and prediabetes. She also has a history of anxiety and was previously prescribed alprazolam, which she no longer takes. She smokes about a pack of cigarettes a day since age 57 and has no current interest in quitting.  No fever or chills. Reports dry mouth and dry eyes. Reports right-sided facial pain and drainage.        Screenings:  Colon Cancer: Cologuard - 3 years ago. No fmh.  Lung Cancer: smoking since age 20 for about 1 ppd. Breast Cancer: normal Cervical Cancer: normal Diabetes: indicated HLD: indicated - on crestor  The 10-year ASCVD risk score (Arnett DK, et al., 2019) is: 6.2%  Outpatient Encounter Medications as of 09/09/2023  Medication Sig   azelastine  (ASTELIN ) 0.1 % nasal spray Place 1 spray into both nostrils 2 (two) times daily. Use in each nostril as directed   fluticasone (FLONASE) 50 MCG/ACT nasal spray Place into both nostrils as needed for allergies or rhinitis.   levocetirizine (XYZAL ) 5 MG tablet Take 1 tablet (5 mg total) by mouth every evening.   pseudoephedrine (SUDAFED) 30 MG tablet Take 30 mg by mouth as needed for congestion.   rosuvastatin  (CRESTOR ) 20 MG tablet Take 1 tablet (20 mg total) by mouth daily.   [DISCONTINUED] cetirizine (ZYRTEC) 10 MG tablet Take 10 mg by mouth daily.   [DISCONTINUED] Cholecalciferol (VITAMIN D-3 PO) Take 1 tablet by mouth daily. Strength unknown   [DISCONTINUED] clobetasol cream (TEMOVATE) 0.05 % Apply 1 application topically 2 (two) times daily as needed  for rash.   [DISCONTINUED] Phenylephrine-Ibuprofen (ADVIL SINUS CONGESTION & PAIN PO) Take 1 tablet by mouth every 6 (six) hours as needed (Sinus Headache).   [DISCONTINUED] vitamin C (ASCORBIC ACID) 500 MG tablet Take 500 mg by mouth daily.   No facility-administered encounter medications on file as of 09/09/2023.    Past Medical History:  Diagnosis Date   ANXIETY 05/12/2007   Qualifier: Diagnosis of  By: Nickola CMA, [place]     DYSPNEA 05/12/2007   Qualifier: History of   By: Nickola CMA, [place]     History of cervical dysplasia 07/20/2020   Internal hemorrhoids with pain & irritation 02/20/2013   Tobacco abuse 02/20/2013    Past Surgical History:  Procedure Laterality Date   BREAST ENHANCEMENT SURGERY  07/23/2010   HEMORRHOID BANDING  02/20/2013   ROOT CANAL  05/2023   Tooth crown  09/04/2023   VULVAR LESION REMOVAL  01/22/2013   Dr Norleen Skill    Family History  Problem Relation Age of Onset   Lung cancer Mother    Heart attack Mother    Diabetes Father    Diabetes Mellitus II Brother    Stomach cancer Maternal Aunt    Diabetes Maternal Aunt    Lung cancer Maternal Grandmother     Social History   Socioeconomic History   Marital status: Legally Separated    Spouse name: Not on file   Number of children: 2   Years of education: Not on file   Highest education level: Not on file  Occupational History   Not on file  Tobacco Use   Smoking status: Every Day    Current packs/day: 1.00    Average packs/day: 1 pack/day for 42.6 years (42.6 ttl pk-yrs)    Types: Cigarettes    Start date: 31    Passive exposure: Past   Smokeless tobacco: Never  Vaping Use   Vaping status: Never Used  Substance and Sexual Activity   Alcohol use: Not Currently    Comment: quit 2020   Drug use: Not Currently    Types: Marijuana    Comment: younger age   Sexual activity: Not Currently  Other Topics Concern   Not on file  Social History Narrative   Not on file   Social Drivers of Health   Financial Resource Strain: Not on file  Food Insecurity: No Food Insecurity (09/09/2023)   Hunger Vital Sign    Worried About Running Out of Food in the Last Year: Never true    Ran Out of Food in the Last Year: Never true  Transportation Needs: No Transportation Needs (09/09/2023)   PRAPARE - Administrator, Civil Service (Medical): No    Lack of Transportation (Non-Medical): No  Physical Activity: Not on file  Stress: Not on file  Social  Connections: Not on file  Intimate Partner Violence: Not At Risk (09/09/2023)   Humiliation, Afraid, Rape, and Kick questionnaire    Fear of Current or Ex-Partner: No    Emotionally Abused: No    Physically Abused: No    Sexually Abused: No    ROS  Per HPI      Objective    BP 129/84   Pulse (!) 104   Temp 98.6 F (37 C) (Oral)   Resp 16   Ht 5' 3.98 (1.625 m)   Wt 167 lb 1.6 oz (75.8 kg)   LMP 11/23/2014 (Approximate)   SpO2 95%   BMI 28.70 kg/m   Physical Exam Constitutional:  General: She is not in acute distress.    Appearance: Normal appearance. She is not ill-appearing.  HENT:     Head: Normocephalic and atraumatic.     Comments: Pain to palpation over R sided frontal and maxillary sinuses.    Right Ear: Tympanic membrane, ear canal and external ear normal.     Left Ear: Tympanic membrane, ear canal and external ear normal.     Nose: Congestion present.     Mouth/Throat:     Mouth: Mucous membranes are moist.     Pharynx: Oropharynx is clear. No oropharyngeal exudate.  Eyes:     General: No scleral icterus.    Extraocular Movements: Extraocular movements intact.     Pupils: Pupils are equal, round, and reactive to light.     Comments: Erythema to bilateral conjunctiva  Neck:     Vascular: No carotid bruit.  Cardiovascular:     Rate and Rhythm: Normal rate and regular rhythm.     Pulses: Normal pulses.     Heart sounds: Normal heart sounds. No murmur heard.    No friction rub.  Pulmonary:     Effort: Pulmonary effort is normal. No respiratory distress.     Breath sounds: Normal breath sounds. No wheezing, rhonchi or rales.  Musculoskeletal:        General: Normal range of motion.     Cervical back: Neck supple.     Right lower leg: No edema.     Left lower leg: No edema.  Skin:    General: Skin is warm and dry.     Coloration: Skin is not jaundiced or pale.  Neurological:     General: No focal deficit present.     Mental Status: She is  alert.     Deep Tendon Reflexes: Reflexes normal.  Psychiatric:        Mood and Affect: Mood normal.        Behavior: Behavior normal.         Assessment & Plan:   Encounter to establish care  Anxiety state  Menopause -     CBC with Differential/Platelet; Future -     Comprehensive metabolic panel with GFR; Future  Encounter for lipid screening for cardiovascular disease -     Lipid panel; Future  Screening for diabetes mellitus -     Hemoglobin A1c; Future  Chronic maxillary sinusitis -     Levocetirizine Dihydrochloride ; Take 1 tablet (5 mg total) by mouth every evening.  Dispense: 30 tablet; Refill: 2 -     Azelastine  HCl; Place 1 spray into both nostrils 2 (two) times daily. Use in each nostril as directed  Dispense: 30 mL; Refill: 5  Screening for lung cancer -     CT CHEST LUNG CANCER SCREENING LOW DOSE WO CONTRAST; Future    Assessment and Plan    Chronic right-sided maxillary sinusitis with facial pain and drainage Chronic, not at goal. Chronic right-sided maxillary sinusitis with facial pain and drainage for several months, worsening in recent weeks. Symptoms include facial pain extending to the jaw, headaches, and drainage with an unpleasant taste and smell. Previous treatments with antibiotics and prednisone provided temporary relief. Not suspecting bacterial issues. No red flags noted. - Continue Flonase nasal spray, two sprays in each nostril twice daily. - Start Xyzal , one tablet nightly. - Prescribe an intranasal antihistamine such as astepro  - Order blood work to check for infection. - Re-evaluate in four weeks to assess symptom improvement. - Consider referral to  ENT if symptoms persist despite treatment. - Order CBC and CMP  Coronary artery calcification Coronary artery calcification, previously evaluated with a coronary artery calcium  score. Managed with metoprolol  and rosuvastatin . No recent imaging performed. - Order low-dose CT of the chest to  monitor coronary artery calcification.  Hyperlipidemia Chronic, stable. Hyperlipidemia managed with rosuvastatin  (Crestor ). No recent lipid panel available from the gynecologist's records. - Order lipid panel to assess current cholesterol levels.  Prediabetes Prediabetes, previously identified as borderline. No current treatment, but advised to monitor carbohydrate intake. - Order HbA1c to assess current glycemic control.  Tobacco use disorder Long-standing tobacco use disorder, smoking approximately one pack per day since age 36. Previous attempts to quit using various methods, including Wellbutrin and Chantix, were unsuccessful. Currently, no interest in quitting smoking, as it is perceived as a primary source of pleasure. - Discuss smoking cessation options if interest in quitting arises in the future.      Return in about 4 weeks (around 10/07/2023) for Chronic Sinusitis.   Miesha Bachmann T Ashlee Player, PA-C

## 2023-09-10 ENCOUNTER — Encounter (HOSPITAL_BASED_OUTPATIENT_CLINIC_OR_DEPARTMENT_OTHER): Payer: Self-pay

## 2023-09-11 ENCOUNTER — Encounter (HOSPITAL_BASED_OUTPATIENT_CLINIC_OR_DEPARTMENT_OTHER): Payer: Self-pay

## 2023-09-13 ENCOUNTER — Other Ambulatory Visit (HOSPITAL_BASED_OUTPATIENT_CLINIC_OR_DEPARTMENT_OTHER)

## 2023-09-13 DIAGNOSIS — Z13220 Encounter for screening for lipoid disorders: Secondary | ICD-10-CM

## 2023-09-13 DIAGNOSIS — J320 Chronic maxillary sinusitis: Secondary | ICD-10-CM

## 2023-09-13 DIAGNOSIS — Z131 Encounter for screening for diabetes mellitus: Secondary | ICD-10-CM

## 2023-09-14 LAB — CBC WITH DIFFERENTIAL/PLATELET
Basophils Absolute: 0.1 x10E3/uL (ref 0.0–0.2)
Basos: 1 %
EOS (ABSOLUTE): 0.1 x10E3/uL (ref 0.0–0.4)
Eos: 2 %
Hematocrit: 38.9 % (ref 34.0–46.6)
Hemoglobin: 13.1 g/dL (ref 11.1–15.9)
Immature Grans (Abs): 0 x10E3/uL (ref 0.0–0.1)
Immature Granulocytes: 0 %
Lymphocytes Absolute: 1.6 x10E3/uL (ref 0.7–3.1)
Lymphs: 21 %
MCH: 32 pg (ref 26.6–33.0)
MCHC: 33.7 g/dL (ref 31.5–35.7)
MCV: 95 fL (ref 79–97)
Monocytes Absolute: 0.7 x10E3/uL (ref 0.1–0.9)
Monocytes: 10 %
Neutrophils Absolute: 5.1 x10E3/uL (ref 1.4–7.0)
Neutrophils: 66 %
Platelets: 233 x10E3/uL (ref 150–450)
RBC: 4.1 x10E6/uL (ref 3.77–5.28)
RDW: 12.5 % (ref 11.7–15.4)
WBC: 7.6 x10E3/uL (ref 3.4–10.8)

## 2023-09-14 LAB — LIPID PANEL
Chol/HDL Ratio: 2.4 ratio (ref 0.0–4.4)
Cholesterol, Total: 133 mg/dL (ref 100–199)
HDL: 56 mg/dL (ref >39)
LDL Chol Calc (NIH): 61 mg/dL (ref 0–99)
Triglycerides: 81 mg/dL (ref 0–149)
VLDL Cholesterol Cal: 16 mg/dL (ref 5–40)

## 2023-09-14 LAB — COMPREHENSIVE METABOLIC PANEL WITH GFR
ALT: 12 IU/L (ref 0–32)
AST: 14 IU/L (ref 0–40)
Albumin: 4.3 g/dL (ref 3.8–4.9)
Alkaline Phosphatase: 52 IU/L (ref 44–121)
BUN/Creatinine Ratio: 13 (ref 9–23)
BUN: 9 mg/dL (ref 6–24)
Bilirubin Total: 0.2 mg/dL (ref 0.0–1.2)
CO2: 23 mmol/L (ref 20–29)
Calcium: 9.1 mg/dL (ref 8.7–10.2)
Chloride: 103 mmol/L (ref 96–106)
Creatinine, Ser: 0.7 mg/dL (ref 0.57–1.00)
Globulin, Total: 1.9 g/dL (ref 1.5–4.5)
Glucose: 92 mg/dL (ref 70–99)
Potassium: 4.5 mmol/L (ref 3.5–5.2)
Sodium: 141 mmol/L (ref 134–144)
Total Protein: 6.2 g/dL (ref 6.0–8.5)
eGFR: 100 mL/min/1.73 (ref >59)

## 2023-09-14 LAB — HEMOGLOBIN A1C
Est. average glucose Bld gHb Est-mCnc: 126 mg/dL
Hgb A1c MFr Bld: 6 % — ABNORMAL HIGH (ref 4.8–5.6)

## 2023-09-16 ENCOUNTER — Ambulatory Visit (HOSPITAL_BASED_OUTPATIENT_CLINIC_OR_DEPARTMENT_OTHER): Payer: Self-pay | Admitting: Student

## 2023-09-17 ENCOUNTER — Ambulatory Visit (INDEPENDENT_AMBULATORY_CARE_PROVIDER_SITE_OTHER)
Admission: RE | Admit: 2023-09-17 | Discharge: 2023-09-17 | Disposition: A | Discharge status: Home / Self Care | Source: Ambulatory Visit | Attending: Student | Admitting: Student

## 2023-09-17 DIAGNOSIS — F17210 Nicotine dependence, cigarettes, uncomplicated: Secondary | ICD-10-CM | POA: Diagnosis not present

## 2023-09-17 DIAGNOSIS — Z122 Encounter for screening for malignant neoplasm of respiratory organs: Secondary | ICD-10-CM

## 2023-09-27 ENCOUNTER — Telehealth (HOSPITAL_BASED_OUTPATIENT_CLINIC_OR_DEPARTMENT_OTHER): Payer: Self-pay | Admitting: Student

## 2023-09-27 NOTE — Telephone Encounter (Signed)
 Copied from CRM [id number]. Topic: Referral - Question >> Sep 27, 2023  1:41 PM [name] wrote: Reason for CRM: Patient called in stating a CT Scan was done today at Dentist appointment and was advised there is a lot of infection on right side, very cloudy in sinus area, so they she needs a referral to ENT. Stated her and Dr. Iven did discuss at last appointment.

## 2023-09-30 ENCOUNTER — Encounter (INDEPENDENT_AMBULATORY_CARE_PROVIDER_SITE_OTHER): Payer: Self-pay

## 2023-09-30 ENCOUNTER — Other Ambulatory Visit (HOSPITAL_BASED_OUTPATIENT_CLINIC_OR_DEPARTMENT_OTHER): Payer: Self-pay | Admitting: Student

## 2023-09-30 DIAGNOSIS — J320 Chronic maxillary sinusitis: Secondary | ICD-10-CM

## 2023-09-30 NOTE — Telephone Encounter (Signed)
Pt advised and voices understanding.

## 2023-10-07 ENCOUNTER — Ambulatory Visit (HOSPITAL_BASED_OUTPATIENT_CLINIC_OR_DEPARTMENT_OTHER): Admitting: Student

## 2023-10-07 ENCOUNTER — Encounter (HOSPITAL_BASED_OUTPATIENT_CLINIC_OR_DEPARTMENT_OTHER): Payer: Self-pay | Admitting: Student

## 2023-10-07 VITALS — BP 129/84 | HR 88 | Temp 98.2°F | Resp 16 | Ht 63.98 in | Wt 173.7 lb

## 2023-10-07 DIAGNOSIS — R7303 Prediabetes: Secondary | ICD-10-CM | POA: Insufficient documentation

## 2023-10-07 DIAGNOSIS — E785 Hyperlipidemia, unspecified: Secondary | ICD-10-CM | POA: Insufficient documentation

## 2023-10-07 DIAGNOSIS — J320 Chronic maxillary sinusitis: Secondary | ICD-10-CM | POA: Diagnosis not present

## 2023-10-07 MED ORDER — ROSUVASTATIN CALCIUM 20 MG PO TABS
20.0000 mg | ORAL_TABLET | Freq: Every day | ORAL | 3 refills | Status: AC
Start: 2023-10-07 — End: 2024-01-05

## 2023-10-07 MED ORDER — DOXYCYCLINE HYCLATE 100 MG PO TABS: 100.0000 mg | ORAL_TABLET | Freq: Two times a day (BID) | ORAL | 0 refills | Status: AC

## 2023-10-07 NOTE — Patient Instructions (Signed)
 It was nice to see you today!  If you have any problems before your next visit feel free to message me via MyChart (minor issues or questions) or call the office, otherwise you may reach out to schedule an office visit.  Thank you! Pau Banh, PA-C

## 2023-10-07 NOTE — Progress Notes (Signed)
 Established Patient Office Visit  Subjective   Patient ID: [name], female    DOB: 1965-11-01  Age: 58 y.o. MRN: [id number]  Chief Complaint  Patient presents with   Medical Management of Chronic Issues    Follow up. Sinus issues have not resolved. They did a CBCT of face/teeth at [facility]. Brought disk. Right side of face was cloudy. Still smelling and tasting infection. Getting right sided headaches 2-3 x a week.    HPI  Discussed the use of AI scribe software for clinical note transcription with the patient, who gave verbal consent to proceed.  History of Present Illness   [name] is a 58 year old female with chronic maxillary sinusitis who presents with persistent sinus symptoms.  She has been experiencing symptoms of chronic maxillary sinusitis since the end of May 2025. Initially, she attempted home treatments for about two and a half weeks before seeking urgent care in June. Her symptoms include a persistent smell, described as 'like a gas,' and a sensation of something stuck in her nose, although nothing comes out when she tries to clear it.  These symptoms have been affecting her jaw, teeth, and gums, leading to a referral from her dentist to a periodontist. She experiences headaches three to four times a week, severe enough to require Advil Sinus and the use of a heat mask for relief. The headaches typically resolve after 30 to 45 minutes but leave her feeling as though her eyes are swollen.  She has been on multiple courses of antibiotics including amoxicillin, clindamycin, and doxycycline , as well as prednisone. Clindamycin provided quick relief but symptoms returned after two weeks. Doxycycline  and prednisone did not provide significant relief in the short term. She has also been using saline irrigation and a steam machine for symptom management.  Her symptoms have impacted her daily life, causing agitation and  weight gain due to inactivity and attempts to mask the taste in her mouth. She has also missed opportunities to travel due to her condition.  She has a history of prediabetes with an A1c of 6.0. She has gained approximately 30 pounds over the past year. Her brother was diagnosed with diabetes last year.        Patient Active Problem List   Diagnosis Date Noted   Prediabetes 10/07/2023   Chronic maxillary sinusitis 10/07/2023   Hyperlipidemia 10/07/2023   History of cervical dysplasia 07/20/2020   Internal hemorrhoids with pain & irritation 02/20/2013   Tobacco abuse 02/20/2013   Anxiety state 05/12/2007   DYSPNEA 05/12/2007   Past Medical History:  Diagnosis Date   ANXIETY 05/12/2007   Qualifier: Diagnosis of  By: Nickola CMA, [place]     DYSPNEA 05/12/2007   Qualifier: History of  By: Nickola CMA, [place]     History of cervical dysplasia 07/20/2020   Internal hemorrhoids with pain & irritation 02/20/2013   Tobacco abuse 02/20/2013   Social History   Tobacco Use   Smoking status: Every Day    Current packs/day: 1.00    Average packs/day: 1 pack/day for 42.7 years (42.7 ttl pk-yrs)    Types: Cigarettes    Start date: 1983    Passive exposure: Past   Smokeless tobacco: Never  Vaping Use   Vaping status: Never Used  Substance Use Topics   Alcohol use: Not Currently    Comment: quit 2020   Drug use: Not Currently    Types: Marijuana    Comment: younger  age   No Known Allergies    ROS Per HPI.    Objective:     BP 129/84   Pulse 88   Temp 98.2 F (36.8 C) (Oral)   Resp 16   Ht 5' 3.98 (1.625 m)   Wt 173 lb 11.2 oz (78.8 kg)   LMP 11/23/2014 (Approximate)   SpO2 96%   BMI 29.83 kg/m  BP Readings from Last 3 Encounters:  10/07/23 129/84  09/09/23 129/84  10/06/20 (!) 144/84   Wt Readings from Last 3 Encounters:  10/07/23 173 lb 11.2 oz (78.8 kg)  09/09/23 167 lb 1.6 oz (75.8 kg)  10/06/20 172 lb (78 kg)      Physical Exam Constitutional:       General: She is not in acute distress.    Appearance: Normal appearance. She is not ill-appearing.  HENT:     Head: Normocephalic and atraumatic.     Nose: Nose normal.     Right Nostril: No foreign body, epistaxis or occlusion.     Left Nostril: No foreign body, epistaxis or occlusion.     Right Turbinates: Enlarged and swollen.     Left Turbinates: Not enlarged or swollen.  Eyes:     General: No scleral icterus.    Conjunctiva/sclera: Conjunctivae normal.  Cardiovascular:     Rate and Rhythm: Normal rate and regular rhythm.     Heart sounds: Normal heart sounds. No murmur heard.    No friction rub.  Pulmonary:     Effort: Pulmonary effort is normal. No respiratory distress.     Breath sounds: Normal breath sounds. No wheezing, rhonchi or rales.  Musculoskeletal:        General: Normal range of motion.  Skin:    General: Skin is warm and dry.     Coloration: Skin is not jaundiced or pale.  Neurological:     General: No focal deficit present.     Mental Status: She is alert.  Psychiatric:        Mood and Affect: Mood normal.        Behavior: Behavior normal.      No results found for any visits on 10/07/23.  Last CBC Lab Results  Component Value Date   WBC 7.6 09/13/2023   HGB 13.1 09/13/2023   HCT 38.9 09/13/2023   MCV 95 09/13/2023   MCH 32.0 09/13/2023   RDW 12.5 09/13/2023   PLT 233 09/13/2023   Last metabolic panel Lab Results  Component Value Date   GLUCOSE 92 09/13/2023   NA 141 09/13/2023   K 4.5 09/13/2023   CL 103 09/13/2023   CO2 23 09/13/2023   BUN 9 09/13/2023   CREATININE 0.70 09/13/2023   EGFR 100 09/13/2023   CALCIUM  9.1 09/13/2023   PROT 6.2 09/13/2023   ALBUMIN 4.3 09/13/2023   LABGLOB 1.9 09/13/2023   BILITOT 0.2 09/13/2023   ALKPHOS 52 09/13/2023   AST 14 09/13/2023   ALT 12 09/13/2023   Last lipids Lab Results  Component Value Date   CHOL 133 09/13/2023   HDL 56 09/13/2023   LDLCALC 61 09/13/2023   TRIG 81 09/13/2023    CHOLHDL 2.4 09/13/2023   Last hemoglobin A1c Lab Results  Component Value Date   HGBA1C 6.0 (H) 09/13/2023      The 10-year ASCVD risk score (Arnett DK, et al., 2019) is: 4.6%    Assessment & Plan:   Assessment and Plan    Chronic maxillary sinusitis Chronic maxillary sinusitis  since May with jaw and teeth discomfort, gum issues, headaches, and persistent bad taste and smell. Previous treatments with amoxicillin, clindamycin, doxycycline , and prednisone provided limited relief. Clindamycin was effective but relief was short-lived. Doxycycline  and prednisone were ineffective at shorter doses. Current symptoms include headaches 3-4 times a week, nasal congestion, and a sensation of something stuck in the nose. Imaging shows infection. ENT referral is pending. - Continue high volume saline irrigation. - Use Flonase daily. - Discontinue azelastine  at this time - Start doxycycline  100 mg BID for 3 weeks, take with food, considering C. diff risk with clindamycin. - Follow up with ENT for further evaluation and management.  Prediabetes A1c is 6.0, indicating prediabetes. Discussed the importance of lifestyle modifications to prevent progression to diabetes, emphasizing dietary changes to reduce carbohydrate and processed sugar intake. - Schedule A1c test in 6 months. - Implement dietary changes to reduce carbohydrate and processed sugar intake. - Encourage regular exercise.  Hyperlipidemia Chronic, stable. - Continue current regimen of Crestor  - Refill Crestor  prescription.      Return in about 6 months (around 04/05/2024) for Annual Physical.    Tymier Lindholm T Mindel Friscia, PA-C

## 2023-10-22 ENCOUNTER — Encounter (INDEPENDENT_AMBULATORY_CARE_PROVIDER_SITE_OTHER): Payer: Self-pay

## 2023-10-22 ENCOUNTER — Ambulatory Visit (INDEPENDENT_AMBULATORY_CARE_PROVIDER_SITE_OTHER)

## 2023-10-22 VITALS — BP 132/80 | HR 93 | Temp 98.0°F | Ht 65.0 in | Wt 167.0 lb

## 2023-10-22 DIAGNOSIS — J342 Deviated nasal septum: Secondary | ICD-10-CM

## 2023-10-22 DIAGNOSIS — J329 Chronic sinusitis, unspecified: Secondary | ICD-10-CM | POA: Diagnosis not present

## 2023-10-22 DIAGNOSIS — J320 Chronic maxillary sinusitis: Secondary | ICD-10-CM

## 2023-10-22 DIAGNOSIS — J343 Hypertrophy of nasal turbinates: Secondary | ICD-10-CM | POA: Diagnosis not present

## 2023-10-22 NOTE — Progress Notes (Signed)
 Dear Dr. Iven, Here is my assessment for our mutual patient, [name]. Thank you for allowing me the opportunity to care for your patient. Please do not hesitate to contact me should you have any other questions. Sincerely, Dr. Penne Croak  Otolaryngology Clinic Note Referring provider: Dr. Iven HPI:  [name] is a 58 y.o. female kindly referred by Dr. Iven for evaluation of right sinusitis.   Reason for Consult  The patient presents with chronic sinusitis symptoms, including drainage and sinus headaches, affecting their quality of life.  History of Present Illness  The patient reports ongoing sinus issues since the end of May or early June, with symptoms including a persistent sewage-like taste and smell, sinus headaches predominantly on the right side, and clear nasal drainage. They have previously undergone a root canal and crown placement on the upper right. The symptoms improved temporarily with antibiotics and prednisone but have recurred multiple times. The patient has been experiencing headaches four to five times a week, with the right side of the face being most affected, sometimes extending to the ear. A CT scan of the dental area was performed showing a right sided maxillary infection per patient.   Independent Review of Additional Tests or Records:  I personally reviewed and interpreted outside CT dated 9/5 from dental office - right maxillary sinus opacification.   PMH/Meds/All/SocHx/FamHx/ROS:   Past Medical History:  Diagnosis Date   ANXIETY 05/12/2007   Qualifier: Diagnosis of  By: Nickola CMA, [place]     DYSPNEA 05/12/2007   Qualifier: History of  By: Nickola CMA, [place]     History of cervical dysplasia 07/20/2020   Internal hemorrhoids with pain & irritation 02/20/2013   Tobacco abuse 02/20/2013     Past Surgical History:  Procedure Laterality Date   BREAST ENHANCEMENT SURGERY  07/23/2010   HEMORRHOID BANDING  02/20/2013    ROOT CANAL  05/2023   Tooth crown  09/04/2023   VULVAR LESION REMOVAL  01/22/2013   Dr Norleen Skill    Family History  Problem Relation Age of Onset   Lung cancer Mother    Heart attack Mother    Diabetes Father    Diabetes Mellitus II Brother    Stomach cancer Maternal Aunt    Diabetes Maternal Aunt    Lung cancer Maternal Grandmother      Social Connections: Socially Isolated (10/04/2023)   Social Connection and Isolation Panel    Frequency of Communication with Friends and Family: Once a week    Frequency of Social Gatherings with Friends and Family: Once a week    Attends Religious Services: Never    Database administrator or Organizations: No    Attends Engineer, structural: Not on file    Marital Status: Separated      Current Outpatient Medications:    azelastine  (ASTELIN ) 0.1 % nasal spray, Place 1 spray into both nostrils 2 (two) times daily. Use in each nostril as directed, Disp: 30 mL, Rfl: 5   doxycycline  (VIBRA -TABS) 100 MG tablet, Take 1 tablet (100 mg total) by mouth 2 (two) times daily for 21 days. Please take with food and remain upright for 30 minutes after taking with a full glass of water., Disp: 42 tablet, Rfl: 0   fluticasone (FLONASE) 50 MCG/ACT nasal spray, Place into both nostrils as needed for allergies or rhinitis. (Patient not taking: Reported on 10/07/2023), Disp: , Rfl:    levocetirizine (XYZAL ) 5 MG tablet, Take 1 tablet (5 mg total) by  mouth every evening., Disp: 30 tablet, Rfl: 2   pseudoephedrine (SUDAFED) 30 MG tablet, Take 30 mg by mouth as needed for congestion. (Patient not taking: Reported on 10/07/2023), Disp: , Rfl:    rosuvastatin  (CRESTOR ) 20 MG tablet, Take 1 tablet (20 mg total) by mouth daily., Disp: 90 tablet, Rfl: 3   Physical Exam:   BP 132/80   Pulse 93   Temp 98 F (36.7 C)   Ht 5' 5 (1.651 m)   Wt 167 lb (75.8 kg)   LMP 11/23/2014 (Approximate)   SpO2 97%   BMI 27.79 kg/m   The patient was awake, alert, and  appropriate. The external ears were inspected, and otoscopy was performed to evaluate the external auditory canals and tympanic membranes. The nasal cavity and septum were examined for mucosal changes, obstruction, or discharge. The oral cavity and oropharynx were inspected for mucosal lesions, infection, or tonsillar hypertrophy. The neck was palpated for lymphadenopathy, thyroid abnormalities, or other masses. Cranial nerve function was grossly intact.  Pertinent Findings: Crusting along head of middle turbinate on the right Mildly deviated nasal septum with turbinate hypertrophy.  No oroantral fistula present. No purulence in oral cavity. There is diffuse gum recession.  Seprately Identifiable Procedures:  I personally ordered, reviewed and interpreted the following with the patient today  Given the patient's symptoms and incomplete visualization of critical sinonasal areas with anterior rhinoscopy, a separately performed diagnostic nasal endoscopy procedure is indicated for a complete rhinologic evaluation per American Rhinologic Society recommendations (https://www.american-rhinologic.org/position-statements)  I personally ordered, reviewed and interpreted the following with the patient today  Procedure Note Diagnostic Nasal Endoscopy CPT CODE -- 68768 - Mod 25  Prior to initiating any procedures, risks/benefits/alternatives were explained to the patient and verbal consent obtained.  Pre-procedure diagnosis: Concern for infection and obstruction Post-procedure diagnosis: same Indication: See pre-procedure diagnosis and physical exam above Complications: None apparent EBL: 0 mL Anesthesia: Lidocaine 4% and topical decongestant was topically sprayed in each nasal cavity  Description of Procedure:  Patient was identified. A flexible fiberoptic endoscope was utilized to evaluate the sinonasal cavities, mucosa, sinus ostia and turbinates and septum.  Overall, signs of mucosal inflammation  are noted.  Also noted are crusting on right middle turbinte.  No mucopurulence, polyps, or masses noted.   Right Middle meatus: edema, congested Right SE Recess: clear Left MM: congested, no purulence Left SE Recess: clear Photodocumentation was obtained.   Impression & Plans:  Minyon Billiter is a 58 y.o. female with right sided chronic maxillary sinusitis and recurrent sinusitis resistant to medical therapy.  1. Chronic maxillary sinusitis   2. Recurrent sinusitis   3. DNS (deviated nasal septum)   4. Hypertrophy of inferior nasal turbinate     Plan - Findings and diagnoses discussed in detail with the patient. - Risks, benefits, and alternatives were reviewed. Through shared decision making, the patient elects to proceed with imaging. - Currently taking doxycycline . Continue for 21 days. Has also been on clindamycin and augmentin. - Orders placed:  Orders Placed This Encounter  Procedures   CT SINUS WO CONTRAST   - Medications prescribed/continued/adjusted: No orders of the defined types were placed in this encounter. - Start saline nasal rinses daily  - Education materials provided to the patient. - Follow up: 3-4 weeks after imaging. Patient instructed to return sooner or go to the ED if new/worsening symptoms develop.  Thank you for allowing me the opportunity to care for your patient. Please do not hesitate to contact me  should you have any other questions.  Sincerely, Penne Croak, DO Otolaryngologist (ENT) [facility] Phone: [phone] Fax: [phone]  10/22/2023, 10:28 PM

## 2023-10-22 NOTE — Patient Instructions (Addendum)
 Take sinupret as directed Perform nasal rinse daily Continue doxycyline

## 2023-11-01 ENCOUNTER — Ambulatory Visit (INDEPENDENT_AMBULATORY_CARE_PROVIDER_SITE_OTHER)
Admission: RE | Admit: 2023-11-01 | Discharge: 2023-11-01 | Disposition: A | Discharge status: Home / Self Care | Source: Ambulatory Visit

## 2023-11-01 DIAGNOSIS — J320 Chronic maxillary sinusitis: Secondary | ICD-10-CM | POA: Diagnosis not present

## 2023-11-05 ENCOUNTER — Telehealth (INDEPENDENT_AMBULATORY_CARE_PROVIDER_SITE_OTHER): Payer: Self-pay

## 2023-11-05 NOTE — Telephone Encounter (Signed)
 Telephone call answered. Updated pt on imaging results. Recommended f/u with dentist or oral surgeon. She wants to discuss options in person 10/27.

## 2023-11-18 ENCOUNTER — Ambulatory Visit (INDEPENDENT_AMBULATORY_CARE_PROVIDER_SITE_OTHER)

## 2023-11-18 ENCOUNTER — Encounter (INDEPENDENT_AMBULATORY_CARE_PROVIDER_SITE_OTHER): Payer: Self-pay

## 2023-11-18 VITALS — BP 140/89 | HR 84 | Temp 98.4°F

## 2023-11-18 DIAGNOSIS — J310 Chronic rhinitis: Secondary | ICD-10-CM | POA: Diagnosis not present

## 2023-11-18 DIAGNOSIS — J320 Chronic maxillary sinusitis: Secondary | ICD-10-CM | POA: Diagnosis not present

## 2023-11-18 DIAGNOSIS — J342 Deviated nasal septum: Secondary | ICD-10-CM

## 2023-11-18 DIAGNOSIS — J343 Hypertrophy of nasal turbinates: Secondary | ICD-10-CM

## 2023-11-18 DIAGNOSIS — T485X5A Adverse effect of other anti-common-cold drugs, initial encounter: Secondary | ICD-10-CM

## 2023-11-18 NOTE — Progress Notes (Signed)
 Dear Dr. Iven, Here is my assessment for our mutual patient, [name]. Thank you for allowing me the opportunity to care for your patient. Please do not hesitate to contact me should you have any other questions. Sincerely, Dr. Penne Croak  Otolaryngology Clinic Note Referring provider: Dr. Iven HPI:  Discussed the use of AI scribe software for clinical note transcription with the patient, who gave verbal consent to proceed.  History of Present Illness [name] is a 58 year old female who presents with sinus issues and dental concerns. She is accompanied by her daughter, [name]. She was referred by a periodontist for evaluation of a potential dental issue causing sinus problems.  Sinonasal symptoms - Frequent sensations of feeling 'a little woozy' - Occasional nasal soreness - No recent headaches - CT scan revealed thickening of tissue in the sinus area - Uses Mucinex nasal spray twice daily - Uses Flonase once daily - History of dependency on Afrin nasal spray - Concerned about developing dependency on current nasal sprays - Experiences anxiety, especially at night, if unable to breathe due to nasal congestion  Dental concerns - Referred by periodontist for evaluation of potential dental etiology of sinus symptoms - CT scan revealed erosion around a tooth root - Informed by periodontist of potential need to see an endodontist for a root canal on a tooth in front of a previous root canal site - Confusion regarding the need for another root canal - Has spent approximately $3,000 on dental care since May and has maxed out dental insurance coverage  Analgesic use - Uses Tylenol for occasional soreness  Independent Review of Additional Tests or Records:  Reviewed external note from referring PCP, Rothfuss,describing RElevant history incorporated into today's evaluation. I personally reviewed and interpreted sinus CT. Mild mucosal thickening right  maxillary sinus. Periapical lucency right posterior molar.  PMH/Meds/All/SocHx/FamHx/ROS:   Past Medical History:  Diagnosis Date   ANXIETY 05/12/2007   Qualifier: Diagnosis of  By: Nickola CMA, [place]     DYSPNEA 05/12/2007   Qualifier: History of  By: Nickola CMA, [place]     History of cervical dysplasia 07/20/2020   Internal hemorrhoids with pain & irritation 02/20/2013   Tobacco abuse 02/20/2013     Past Surgical History:  Procedure Laterality Date   BREAST ENHANCEMENT SURGERY  07/23/2010   HEMORRHOID BANDING  02/20/2013   ROOT CANAL  05/2023   Tooth crown  09/04/2023   VULVAR LESION REMOVAL  01/22/2013   Dr Norleen Skill    Family History  Problem Relation Age of Onset   Lung cancer Mother    Heart attack Mother    Diabetes Father    Diabetes Mellitus II Brother    Stomach cancer Maternal Aunt    Diabetes Maternal Aunt    Lung cancer Maternal Grandmother      Social Connections: Socially Isolated (10/04/2023)   Social Connection and Isolation Panel    Frequency of Communication with Friends and Family: Once a week    Frequency of Social Gatherings with Friends and Family: Once a week    Attends Religious Services: Never    Database Administrator or Organizations: No    Attends Engineer, Structural: Not on file    Marital Status: Separated      Current Outpatient Medications:    azelastine  (ASTELIN ) 0.1 % nasal spray, Place 1 spray into both nostrils 2 (two) times daily. Use in each nostril as directed, Disp: 30 mL, Rfl: 5  fluticasone (FLONASE) 50 MCG/ACT nasal spray, Place into both nostrils as needed for allergies or rhinitis. (Patient not taking: Reported on 10/07/2023), Disp: , Rfl:    levocetirizine (XYZAL ) 5 MG tablet, Take 1 tablet (5 mg total) by mouth every evening., Disp: 30 tablet, Rfl: 2   pseudoephedrine (SUDAFED) 30 MG tablet, Take 30 mg by mouth as needed for congestion. (Patient not taking: Reported on 10/07/2023), Disp: , Rfl:    rosuvastatin   (CRESTOR ) 20 MG tablet, Take 1 tablet (20 mg total) by mouth daily., Disp: 90 tablet, Rfl: 3   Physical Exam:   BP (!) 140/89   Pulse 84   Temp 98.4 F (36.9 C)   LMP 11/23/2014 (Approximate)   SpO2 94%   The patient was awake, alert, and appropriate. The external ears were inspected, and otoscopy was performed to evaluate the external auditory canals and tympanic membranes. The nasal cavity and septum were examined for mucosal changes, obstruction, or discharge. The oral cavity and oropharynx were inspected for mucosal lesions, infection, or tonsillar hypertrophy. The neck was palpated for lymphadenopathy, thyroid abnormalities, or other masses. Cranial nerve function was grossly intact.  Pertinent Findings: Deviated nasal septum 3+ turbinate hypertrophy No oroantral fistula   Impression & Plans:  [name] is a 58 y.o. female  1. Right maxillary sinusitis, chronic   2. Rhinitis medicamentosa   3. DNS (deviated nasal septum)   4. Hypertrophy of inferior nasal turbinate     - Findings and diagnoses discussed in detail with the patient. - Risks, benefits, and alternatives were reviewed. Through shared decision making, the patient elects to proceed with below. Assessment & Plan Odontogenic maxillary sinusitis Chronic odontogenic maxillary sinusitis likely due to dental issue, with CT showing tooth root erosion and sinus thickening. Symptoms include anxiety and nocturnal breathing difficulty related to nasal congestion. - Refer to endodontist for evaluation and treatment of problematic tooth. - Consider sinus surgery if symptoms persist post dental treatment.  - patient can call to schedule septoplasty, inferior turbinate reduction and right maxillary antrostomy if she elects to proceed with surgery. - Monitor symptoms for one month post dental treatment to assess need for sinus surgery.  Recurrent nasal decongestant dependence Dependence on Mucinex nasal spray causing  anxiety and nocturnal breathing difficulty. Current use is twice daily. Previous Afrin dependence resolved with medical assistance. - Increase Flonase to twice daily. - Use ocean saline spray as needed for nasal congestion. - Gradually reduce Mucinex nasal spray usage, either by tapering or stopping abruptly. - Avoid oral steroids due to potential side effects on sleep and mood.  - Orders placed: No orders of the defined types were placed in this encounter.  - Medications prescribed/continued/adjusted: No orders of the defined types were placed in this encounter.  - Education materials provided to the patient. - Follow up: after endodontist or call to schedule surgery. Patient instructed to return sooner or go to the ED if new/worsening symptoms develop.  Thank you for allowing me the opportunity to care for your patient. Please do not hesitate to contact me should you have any other questions.  Sincerely, Penne Croak, DO Otolaryngologist (ENT) [facility] Phone: [phone] Fax: [phone]  11/18/2023, 9:31 PM   I spent 49 minutes (exclusive of separately billable procedures) on the day of the encounter reviewing the patient's chart, seeing the patient face to face, discussing the findings and the treatment plan, and documenting in the EHR.

## 2023-12-04 ENCOUNTER — Other Ambulatory Visit (HOSPITAL_BASED_OUTPATIENT_CLINIC_OR_DEPARTMENT_OTHER): Payer: Self-pay | Admitting: Student

## 2023-12-04 DIAGNOSIS — J320 Chronic maxillary sinusitis: Secondary | ICD-10-CM

## 2024-04-08 ENCOUNTER — Encounter (HOSPITAL_BASED_OUTPATIENT_CLINIC_OR_DEPARTMENT_OTHER): Admitting: Student
